# Patient Record
Sex: Female | Born: 1960 | Race: Black or African American | Hispanic: No | Marital: Married | Smoking: Former smoker
Health system: Southern US, Community
[De-identification: ages and names within clinical notes are randomized; demographics above are authoritative.]

## PROBLEM LIST (undated history)

## (undated) ENCOUNTER — Ambulatory Visit: Discharge status: Absent without leave | Payer: BC Managed Care – PPO

## (undated) DIAGNOSIS — I10 Essential (primary) hypertension: Secondary | ICD-10-CM

## (undated) DIAGNOSIS — E7800 Pure hypercholesterolemia, unspecified: Secondary | ICD-10-CM

## (undated) HISTORY — PX: OTHER SURGICAL HISTORY: SHX169

## (undated) HISTORY — PX: ABDOMINAL HYSTERECTOMY: SHX81

---

## 2001-01-27 ENCOUNTER — Emergency Department (HOSPITAL_COMMUNITY): Admission: EM | Admit: 2001-01-27 | Discharge: 2001-01-27 | Payer: Self-pay | Admitting: Emergency Medicine

## 2001-05-09 ENCOUNTER — Other Ambulatory Visit: Admission: RE | Admit: 2001-05-09 | Discharge: 2001-05-09 | Payer: Self-pay | Admitting: Obstetrics and Gynecology

## 2001-05-30 ENCOUNTER — Encounter: Payer: Self-pay | Admitting: Obstetrics and Gynecology

## 2001-05-30 ENCOUNTER — Ambulatory Visit (HOSPITAL_COMMUNITY): Admission: RE | Admit: 2001-05-30 | Discharge: 2001-05-30 | Payer: Self-pay | Admitting: Obstetrics and Gynecology

## 2001-06-25 ENCOUNTER — Ambulatory Visit (HOSPITAL_COMMUNITY): Admission: RE | Admit: 2001-06-25 | Discharge: 2001-06-25 | Payer: Self-pay | Admitting: Obstetrics and Gynecology

## 2001-06-25 ENCOUNTER — Encounter: Payer: Self-pay | Admitting: Obstetrics and Gynecology

## 2001-07-12 ENCOUNTER — Ambulatory Visit (HOSPITAL_COMMUNITY): Admission: RE | Admit: 2001-07-12 | Discharge: 2001-07-12 | Payer: Self-pay | Admitting: General Surgery

## 2003-03-09 ENCOUNTER — Emergency Department (HOSPITAL_COMMUNITY): Admission: EM | Admit: 2003-03-09 | Discharge: 2003-03-09 | Payer: Self-pay | Admitting: *Deleted

## 2003-05-08 ENCOUNTER — Emergency Department (HOSPITAL_COMMUNITY): Admission: EM | Admit: 2003-05-08 | Discharge: 2003-05-08 | Payer: Self-pay | Admitting: *Deleted

## 2003-06-12 ENCOUNTER — Ambulatory Visit (HOSPITAL_COMMUNITY): Admission: RE | Admit: 2003-06-12 | Discharge: 2003-06-12 | Payer: Self-pay | Admitting: General Surgery

## 2003-06-12 ENCOUNTER — Encounter: Payer: Self-pay | Admitting: General Surgery

## 2004-07-20 ENCOUNTER — Emergency Department (HOSPITAL_COMMUNITY): Admission: EM | Admit: 2004-07-20 | Discharge: 2004-07-20 | Payer: Self-pay | Admitting: Emergency Medicine

## 2004-11-08 ENCOUNTER — Ambulatory Visit (HOSPITAL_COMMUNITY): Admission: RE | Admit: 2004-11-08 | Discharge: 2004-11-08 | Payer: Self-pay | Admitting: General Surgery

## 2005-02-04 ENCOUNTER — Emergency Department (HOSPITAL_COMMUNITY): Admission: EM | Admit: 2005-02-04 | Discharge: 2005-02-04 | Payer: Self-pay | Admitting: *Deleted

## 2006-01-15 ENCOUNTER — Ambulatory Visit (HOSPITAL_COMMUNITY): Admission: RE | Admit: 2006-01-15 | Discharge: 2006-01-15 | Payer: Self-pay | Admitting: General Surgery

## 2006-01-15 ENCOUNTER — Emergency Department (HOSPITAL_COMMUNITY): Admission: EM | Admit: 2006-01-15 | Discharge: 2006-01-15 | Payer: Self-pay | Admitting: Emergency Medicine

## 2006-01-26 ENCOUNTER — Emergency Department (HOSPITAL_COMMUNITY): Admission: EM | Admit: 2006-01-26 | Discharge: 2006-01-26 | Payer: Self-pay | Admitting: Emergency Medicine

## 2006-02-02 ENCOUNTER — Ambulatory Visit (HOSPITAL_COMMUNITY): Admission: RE | Admit: 2006-02-02 | Discharge: 2006-02-02 | Payer: Self-pay | Admitting: General Surgery

## 2006-02-06 ENCOUNTER — Encounter (HOSPITAL_COMMUNITY): Admission: RE | Admit: 2006-02-06 | Discharge: 2006-03-08 | Payer: Self-pay | Admitting: General Surgery

## 2006-03-09 ENCOUNTER — Encounter (HOSPITAL_COMMUNITY): Admission: RE | Admit: 2006-03-09 | Discharge: 2006-04-08 | Payer: Self-pay | Admitting: General Surgery

## 2006-05-02 ENCOUNTER — Ambulatory Visit: Payer: Self-pay | Admitting: Family Medicine

## 2006-05-21 ENCOUNTER — Encounter (INDEPENDENT_AMBULATORY_CARE_PROVIDER_SITE_OTHER): Payer: Self-pay | Admitting: Family Medicine

## 2006-05-21 LAB — CONVERTED CEMR LAB
RBC count: 4.63 10*6/uL
TSH: 0.711 microintl units/mL
WBC, blood: 7.1 10*3/uL

## 2006-06-05 ENCOUNTER — Ambulatory Visit: Payer: Self-pay | Admitting: Family Medicine

## 2006-06-12 ENCOUNTER — Encounter (HOSPITAL_COMMUNITY): Admission: RE | Admit: 2006-06-12 | Discharge: 2006-07-12 | Payer: Self-pay | Admitting: Family Medicine

## 2006-07-16 ENCOUNTER — Encounter (HOSPITAL_COMMUNITY): Admission: RE | Admit: 2006-07-16 | Discharge: 2006-08-15 | Payer: Self-pay | Admitting: Family Medicine

## 2006-07-17 ENCOUNTER — Ambulatory Visit: Payer: Self-pay | Admitting: Family Medicine

## 2006-08-08 ENCOUNTER — Ambulatory Visit: Payer: Self-pay | Admitting: Family Medicine

## 2006-08-14 ENCOUNTER — Ambulatory Visit: Payer: Self-pay | Admitting: Family Medicine

## 2006-08-16 ENCOUNTER — Encounter (HOSPITAL_COMMUNITY): Admission: RE | Admit: 2006-08-16 | Discharge: 2006-09-03 | Payer: Self-pay | Admitting: Family Medicine

## 2006-08-18 ENCOUNTER — Encounter: Payer: Self-pay | Admitting: Family Medicine

## 2006-08-18 DIAGNOSIS — R7989 Other specified abnormal findings of blood chemistry: Secondary | ICD-10-CM | POA: Insufficient documentation

## 2006-08-18 DIAGNOSIS — E1169 Type 2 diabetes mellitus with other specified complication: Secondary | ICD-10-CM | POA: Insufficient documentation

## 2006-08-18 DIAGNOSIS — L259 Unspecified contact dermatitis, unspecified cause: Secondary | ICD-10-CM

## 2006-08-18 DIAGNOSIS — E785 Hyperlipidemia, unspecified: Secondary | ICD-10-CM

## 2006-08-18 DIAGNOSIS — E669 Obesity, unspecified: Secondary | ICD-10-CM

## 2006-08-18 DIAGNOSIS — I10 Essential (primary) hypertension: Secondary | ICD-10-CM | POA: Insufficient documentation

## 2006-08-18 DIAGNOSIS — M1990 Unspecified osteoarthritis, unspecified site: Secondary | ICD-10-CM | POA: Insufficient documentation

## 2006-09-25 ENCOUNTER — Ambulatory Visit: Payer: Self-pay | Admitting: Family Medicine

## 2006-09-25 ENCOUNTER — Ambulatory Visit (HOSPITAL_COMMUNITY): Admission: RE | Admit: 2006-09-25 | Discharge: 2006-09-25 | Payer: Self-pay | Admitting: Family Medicine

## 2006-09-26 ENCOUNTER — Encounter (INDEPENDENT_AMBULATORY_CARE_PROVIDER_SITE_OTHER): Payer: Self-pay | Admitting: Family Medicine

## 2006-09-26 LAB — CONVERTED CEMR LAB: Uric Acid, Serum: 5.7 mg/dL (ref 2.4–7.0)

## 2006-10-23 ENCOUNTER — Ambulatory Visit: Payer: Self-pay | Admitting: Family Medicine

## 2006-10-23 LAB — CONVERTED CEMR LAB
Cholesterol, target level: 200 mg/dL
LDL Goal: 160 mg/dL

## 2006-11-06 ENCOUNTER — Encounter (INDEPENDENT_AMBULATORY_CARE_PROVIDER_SITE_OTHER): Payer: Self-pay | Admitting: Family Medicine

## 2006-12-04 ENCOUNTER — Encounter (INDEPENDENT_AMBULATORY_CARE_PROVIDER_SITE_OTHER): Payer: Self-pay | Admitting: Family Medicine

## 2007-01-14 ENCOUNTER — Encounter (INDEPENDENT_AMBULATORY_CARE_PROVIDER_SITE_OTHER): Payer: Self-pay | Admitting: Family Medicine

## 2007-03-29 IMAGING — CR DG ANKLE COMPLETE 3+V*R*
3 series · 3 of 3 positions shown · non-contrast
Comparison: None.

CLINICAL DATA: Pain.
 RIGHT ANKLE ? 3 VIEWS:

[view not recorded (1 of 3)]
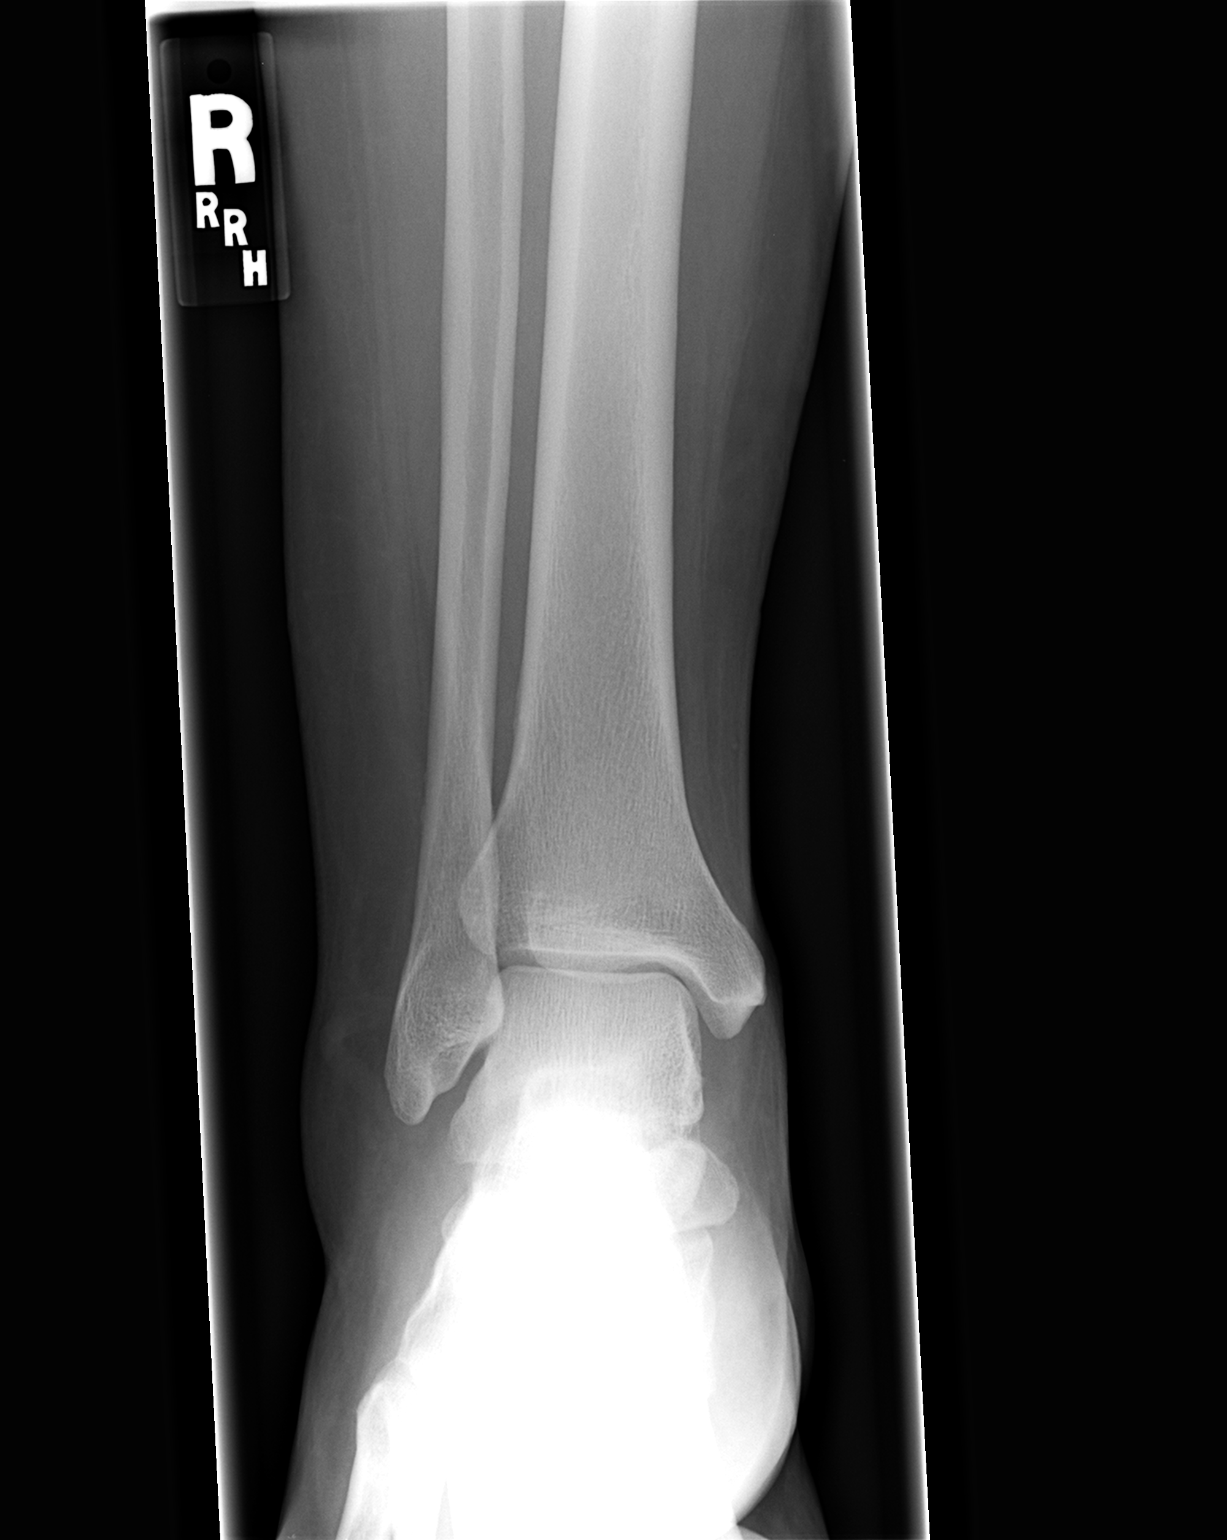

[view not recorded (2 of 3)]
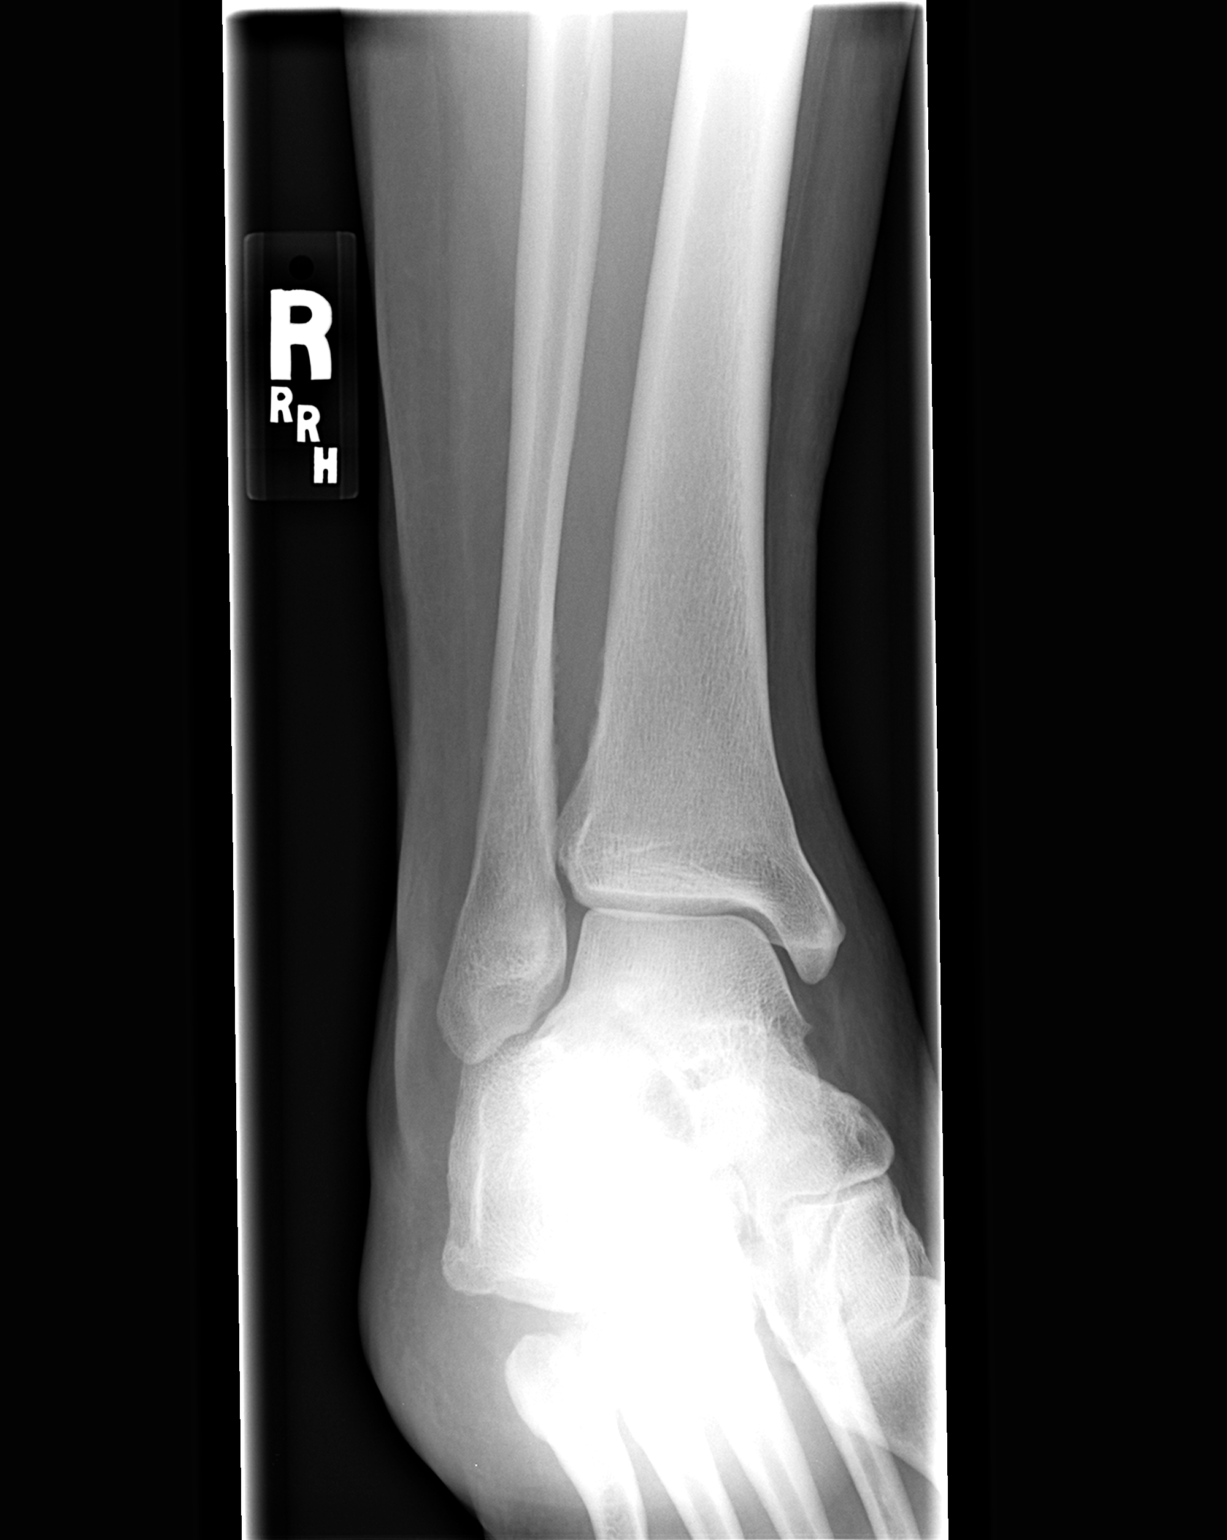

[view not recorded (3 of 3)]
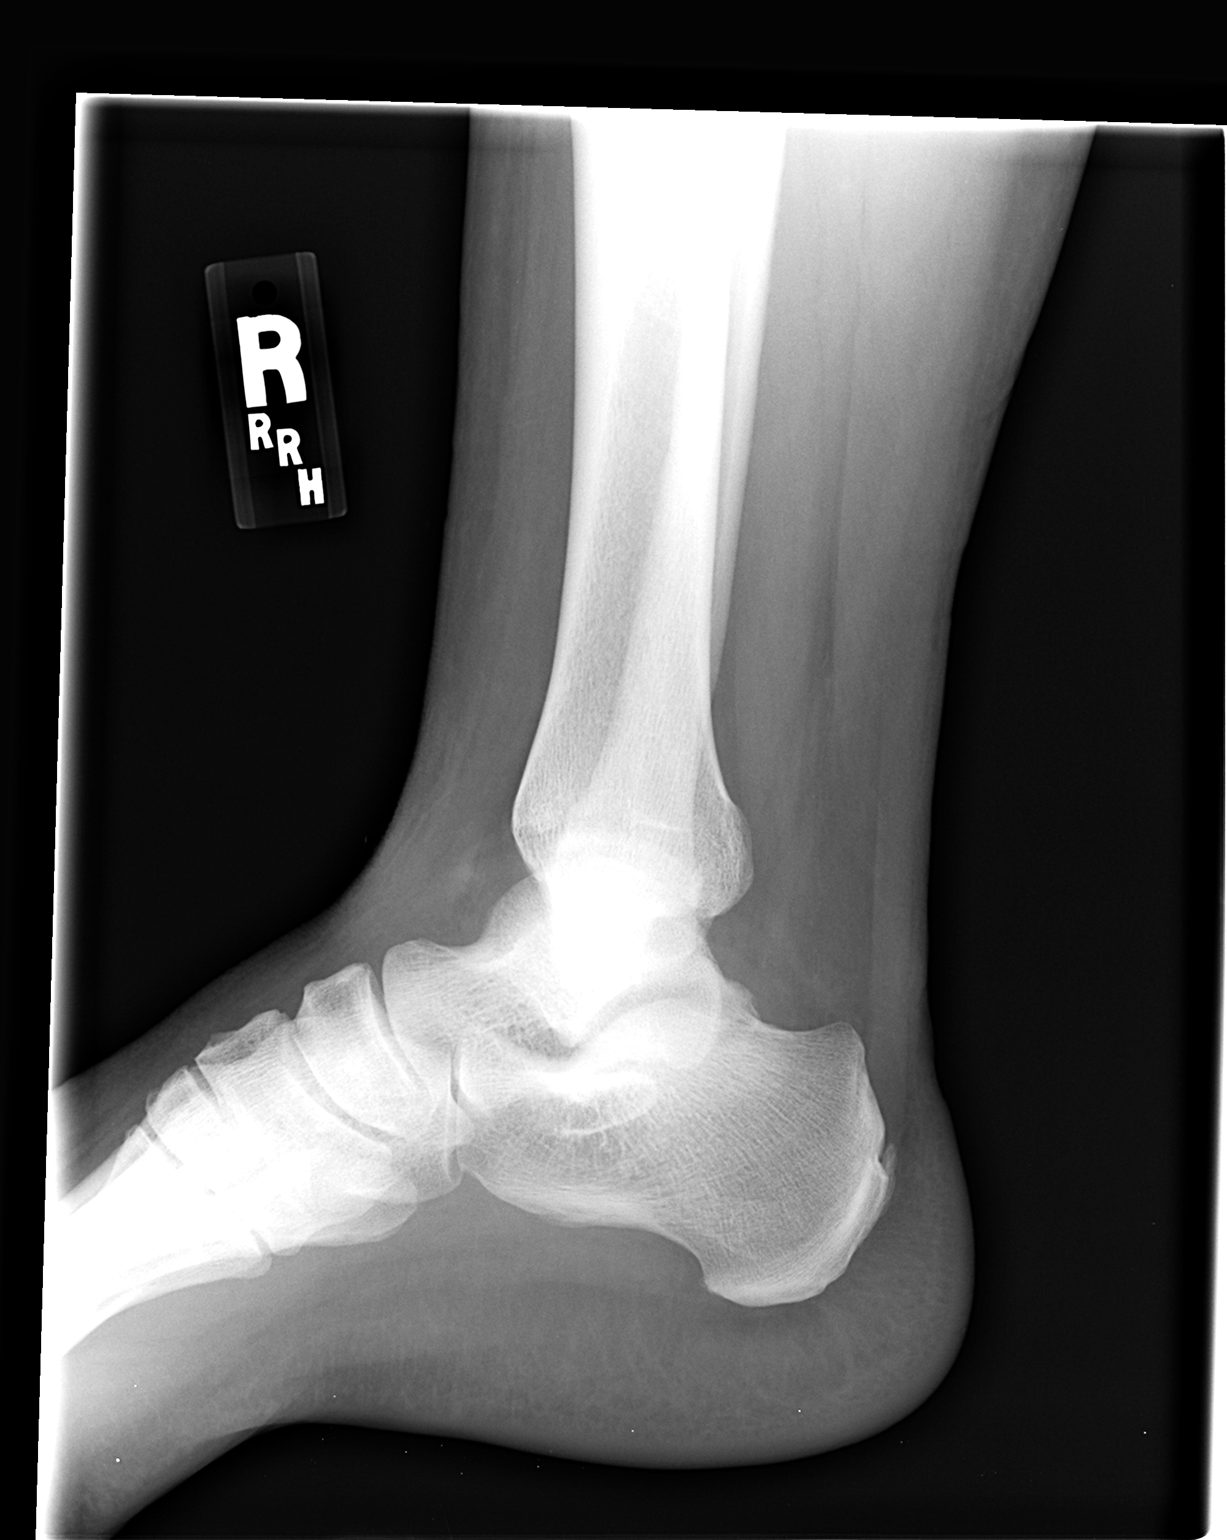

[3 of 3 positions shown; findings below may reference images not displayed]

FINDINGS: Imaged bones, joints, and soft tissues appear normal.
IMPRESSION: No acute finding.

## 2007-05-31 ENCOUNTER — Ambulatory Visit: Payer: Self-pay | Admitting: Family Medicine

## 2007-07-12 ENCOUNTER — Ambulatory Visit: Payer: Self-pay | Admitting: Family Medicine

## 2007-07-12 ENCOUNTER — Telehealth (INDEPENDENT_AMBULATORY_CARE_PROVIDER_SITE_OTHER): Payer: Self-pay | Admitting: *Deleted

## 2007-09-27 ENCOUNTER — Emergency Department (HOSPITAL_COMMUNITY): Admission: EM | Admit: 2007-09-27 | Discharge: 2007-09-27 | Payer: Self-pay | Admitting: Emergency Medicine

## 2007-10-02 ENCOUNTER — Encounter (INDEPENDENT_AMBULATORY_CARE_PROVIDER_SITE_OTHER): Payer: Self-pay | Admitting: Family Medicine

## 2007-10-03 ENCOUNTER — Encounter (INDEPENDENT_AMBULATORY_CARE_PROVIDER_SITE_OTHER): Payer: Self-pay | Admitting: Family Medicine

## 2007-10-18 ENCOUNTER — Ambulatory Visit (HOSPITAL_COMMUNITY): Admission: RE | Admit: 2007-10-18 | Discharge: 2007-10-18 | Payer: Self-pay | Admitting: Family Medicine

## 2007-10-18 ENCOUNTER — Encounter (INDEPENDENT_AMBULATORY_CARE_PROVIDER_SITE_OTHER): Payer: Self-pay | Admitting: Family Medicine

## 2007-10-19 LAB — CONVERTED CEMR LAB
BUN: 9 mg/dL (ref 6–23)
Basophils Absolute: 0 10*3/uL (ref 0.0–0.1)
CO2: 20 meq/L (ref 19–32)
Calcium: 9.2 mg/dL (ref 8.4–10.5)
Eosinophils Relative: 3 % (ref 0–5)
Glucose, Bld: 93 mg/dL (ref 70–99)
HDL: 79 mg/dL (ref >39)
LDL Cholesterol: 107 mg/dL — ABNORMAL HIGH (ref 0–99)
Lymphocytes Relative: 59 % — ABNORMAL HIGH (ref 12–46)
Lymphs Abs: 3.5 10*3/uL (ref 0.7–4.0)
MCHC: 32.5 g/dL (ref 30.0–36.0)
MCV: 82.5 fL (ref 78.0–100.0)
Monocytes Absolute: 0.4 10*3/uL (ref 0.1–1.0)
Monocytes Relative: 6 % (ref 3–12)
Neutro Abs: 1.9 10*3/uL (ref 1.7–7.7)
Potassium: 3.8 meq/L (ref 3.5–5.3)
RBC: 4.29 M/uL (ref 3.87–5.11)
Sodium: 142 meq/L (ref 135–145)
Total Bilirubin: 0.4 mg/dL (ref 0.3–1.2)
WBC: 5.9 10*3/uL (ref 4.0–10.5)

## 2007-10-21 ENCOUNTER — Telehealth (INDEPENDENT_AMBULATORY_CARE_PROVIDER_SITE_OTHER): Payer: Self-pay | Admitting: *Deleted

## 2007-10-21 ENCOUNTER — Encounter (INDEPENDENT_AMBULATORY_CARE_PROVIDER_SITE_OTHER): Payer: Self-pay | Admitting: Family Medicine

## 2007-10-25 ENCOUNTER — Ambulatory Visit: Payer: Self-pay | Admitting: Family Medicine

## 2007-10-25 DIAGNOSIS — D649 Anemia, unspecified: Secondary | ICD-10-CM

## 2007-10-31 LAB — CONVERTED CEMR LAB: Retic Ct Pct: 1.1 % (ref 0.4–3.1)

## 2007-11-29 ENCOUNTER — Encounter (INDEPENDENT_AMBULATORY_CARE_PROVIDER_SITE_OTHER): Payer: Self-pay | Admitting: Family Medicine

## 2008-02-07 ENCOUNTER — Ambulatory Visit: Payer: Self-pay | Admitting: Family Medicine

## 2008-02-07 LAB — CONVERTED CEMR LAB
Glucose, Urine, Semiquant: NEGATIVE
Protein, U semiquant: NEGATIVE
Urobilinogen, UA: 1
pH: 5.5

## 2008-02-08 ENCOUNTER — Encounter (INDEPENDENT_AMBULATORY_CARE_PROVIDER_SITE_OTHER): Payer: Self-pay | Admitting: Family Medicine

## 2008-02-19 ENCOUNTER — Encounter (INDEPENDENT_AMBULATORY_CARE_PROVIDER_SITE_OTHER): Payer: Self-pay | Admitting: *Deleted

## 2008-02-19 LAB — CONVERTED CEMR LAB
BUN: 17 mg/dL (ref 6–23)
CO2: 22 meq/L (ref 19–32)
Calcium: 9.9 mg/dL (ref 8.4–10.5)
Creatinine, Ser: 0.77 mg/dL (ref 0.40–1.20)
Eosinophils Absolute: 0.3 10*3/uL (ref 0.0–0.7)
HCT: 39 % (ref 36.0–46.0)
Hemoglobin: 12.3 g/dL (ref 12.0–15.0)
Lymphs Abs: 3.4 10*3/uL (ref 0.7–4.0)
Neutro Abs: 3.3 10*3/uL (ref 1.7–7.7)
Platelets: 285 10*3/uL (ref 150–400)
Potassium: 3.7 meq/L (ref 3.5–5.3)
RBC / HPF: NONE SEEN (ref <3)
RBC: 4.73 M/uL (ref 3.87–5.11)
RDW: 14.6 % (ref 11.5–15.5)
Sodium: 143 meq/L (ref 135–145)
WBC: 7.4 10*3/uL (ref 4.0–10.5)

## 2008-03-20 ENCOUNTER — Ambulatory Visit: Payer: Self-pay | Admitting: Family Medicine

## 2008-06-19 ENCOUNTER — Encounter (INDEPENDENT_AMBULATORY_CARE_PROVIDER_SITE_OTHER): Payer: Self-pay | Admitting: Family Medicine

## 2008-09-24 ENCOUNTER — Ambulatory Visit: Payer: Self-pay | Admitting: Family Medicine

## 2008-10-16 ENCOUNTER — Encounter (INDEPENDENT_AMBULATORY_CARE_PROVIDER_SITE_OTHER): Payer: Self-pay | Admitting: Family Medicine

## 2008-10-22 LAB — CONVERTED CEMR LAB
ALT: 16 units/L (ref 0–35)
Albumin: 4.4 g/dL (ref 3.5–5.2)
Basophils Relative: 0 % (ref 0–1)
Cholesterol: 226 mg/dL — ABNORMAL HIGH (ref 0–200)
Creatinine, Ser: 0.71 mg/dL (ref 0.40–1.20)
Eosinophils Relative: 3 % (ref 0–5)
HDL: 84 mg/dL (ref >39)
Hemoglobin: 11.9 g/dL — ABNORMAL LOW (ref 12.0–15.0)
LDL Cholesterol: 126 mg/dL — ABNORMAL HIGH (ref 0–99)
MCHC: 31.6 g/dL (ref 30.0–36.0)
Monocytes Absolute: 0.6 10*3/uL (ref 0.1–1.0)
RBC: 4.67 M/uL (ref 3.87–5.11)
Sodium: 140 meq/L (ref 135–145)
TSH: 0.834 microintl units/mL (ref 0.350–4.50)
Total Bilirubin: 0.4 mg/dL (ref 0.3–1.2)
Total CHOL/HDL Ratio: 2.7
VLDL: 16 mg/dL (ref 0–40)
WBC: 9.4 10*3/uL (ref 4.0–10.5)

## 2008-10-26 ENCOUNTER — Ambulatory Visit: Payer: Self-pay | Admitting: Family Medicine

## 2008-10-26 DIAGNOSIS — J209 Acute bronchitis, unspecified: Secondary | ICD-10-CM

## 2008-10-26 DIAGNOSIS — IMO0001 Reserved for inherently not codable concepts without codable children: Secondary | ICD-10-CM

## 2008-10-27 ENCOUNTER — Telehealth (INDEPENDENT_AMBULATORY_CARE_PROVIDER_SITE_OTHER): Payer: Self-pay | Admitting: Family Medicine

## 2008-10-27 ENCOUNTER — Encounter (INDEPENDENT_AMBULATORY_CARE_PROVIDER_SITE_OTHER): Payer: Self-pay | Admitting: Family Medicine

## 2008-11-05 ENCOUNTER — Telehealth (INDEPENDENT_AMBULATORY_CARE_PROVIDER_SITE_OTHER): Payer: Self-pay | Admitting: Family Medicine

## 2008-11-24 ENCOUNTER — Encounter (INDEPENDENT_AMBULATORY_CARE_PROVIDER_SITE_OTHER): Payer: Self-pay | Admitting: Family Medicine

## 2009-02-02 ENCOUNTER — Emergency Department (HOSPITAL_COMMUNITY): Admission: EM | Admit: 2009-02-02 | Discharge: 2009-02-02 | Payer: Self-pay | Admitting: Emergency Medicine

## 2009-02-15 ENCOUNTER — Ambulatory Visit (HOSPITAL_COMMUNITY): Admission: RE | Admit: 2009-02-15 | Discharge: 2009-02-15 | Payer: Self-pay | Admitting: Family Medicine

## 2010-09-24 ENCOUNTER — Encounter: Payer: Self-pay | Admitting: General Surgery

## 2010-09-25 ENCOUNTER — Encounter: Payer: Self-pay | Admitting: General Surgery

## 2010-09-25 ENCOUNTER — Encounter: Payer: Self-pay | Admitting: Family Medicine

## 2010-12-12 LAB — CBC
MCV: 81.2 fL (ref 78.0–100.0)
Platelets: 229 10*3/uL (ref 150–400)
RDW: 15.1 % (ref 11.5–15.5)

## 2010-12-12 LAB — DIFFERENTIAL
Basophils Relative: 0 % (ref 0–1)
Eosinophils Absolute: 0.3 10*3/uL (ref 0.0–0.7)
Monocytes Relative: 8 % (ref 3–12)
Neutro Abs: 2.8 10*3/uL (ref 1.7–7.7)

## 2010-12-12 LAB — BASIC METABOLIC PANEL
BUN: 13 mg/dL (ref 6–23)
CO2: 29 mEq/L (ref 19–32)
Calcium: 9.3 mg/dL (ref 8.4–10.5)
GFR calc Af Amer: >60 mL/min (ref >60)
Glucose, Bld: 121 mg/dL — ABNORMAL HIGH (ref 70–99)
Sodium: 141 mEq/L (ref 135–145)

## 2011-01-20 NOTE — H&P (Signed)
[facility]  Patient:    Beverly Mccann Visit Number: [id number] MRN: [id number]          Service Type: OUT Location: RAD Attending Physician:  Tilda Burrow Dictated by:   Elpidio Anis, M.D. Admit Date:  06/25/2001 Discharge Date: 06/25/2001                           History and Physical  CHIEF COMPLAINT: This is a 50 year old female, with a history of a new nodule in her left breast on routine examination.  HISTORY OF PRESENT ILLNESS: Mammography showed a density in the left upper outer quadrant of the breast and ultrasound showed an isoechoic mass of the breast between 12:30 and 1 oclock which measured 1.7 x 1.4 x 0.8 cm.  The mass was felt to be suggestive of either fibroadenoma or solid neoplasm.  She is scheduled for partial mastectomy.  PAST MEDICAL HISTORY: She has hypertension.  MEDICATIONS:  1. Cardura 4 mg q.d.  2. Lotrel 5/10 mg q.d.  PAST SURGICAL HISTORY: Total abdominal hysterectomy.  SOCIAL HISTORY: She smokes one pack a day.  She drinks an occasional beer. She does not use drugs.  ALLERGIES: No known drug allergies.  PHYSICAL EXAMINATION:  VITAL SIGNS: Blood pressure 150/100, pulse 84, respirations 20.  Weight 192 pounds.  HEENT: Unremarkable.  NECK: Supple without JVD or bruits.  CHEST: Clear to auscultation.  HEART: Regular rate and rhythm without murmurs, rubs, or gallops.  BREAST: Mass in upper outer quadrant of breast near the tail of breast which is soft.  There is a nontender node of the lower axilla.  ABDOMEN: Soft, nontender, no masses, normoactive bowel sounds.  EXTREMITIES: No clubbing, cyanosis, or edema.  No joint deformity.  NEUROLOGIC: Nonfocal.  No cerebellar deficit.  IMPRESSION:  1. Left breast mass, upper outer quadrant.  2. Hypertension.  PLAN: Left partial mastectomy.Dictated by:   Elpidio Anis, M.D.  Attending Physician:  Tilda Burrow DD:  07/11/01 TD:  07/12/01 Job:  [id number]

## 2011-01-20 NOTE — Op Note (Signed)
[facility]  Patient:    Beverly Mccann, Beverly Mccann Visit Number: [id number] MRN: [id number]          Service Type: DSU Location: DAY Attending Physician:  Dessa Phi Dictated by:   Elpidio Anis, M.D. Admit Date:  07/12/2001                             Operative Report  PREOPERATIVE DIAGNOSIS:  Left breast mass.  POSTOPERATIVE DIAGNOSIS:  Left breast mass.  OPERATION:  Left partial mastectomy.  SURGEON:  Elpidio Anis, M.D.  DESCRIPTION OF PROCEDURE:   Under general endotracheal anesthesia, the patients breast was prepped and draped in sterile field.  Curvilinear incision was made in the upper outer quadrant near the base of the axilla. Incision extended through subcutaneous tissue.  The mass was palpated and grasped with an Allis forceps.  Wide margin of tissue was taken around the mass the ensure complete removal.  The dissection extended out to the chest wall.  The mass was sent for pathologic evaluation.  Frozen section diagnosis returned as probable benign.  Permanent section is pending.  The deep tissues were closed with interrupted 2-0 Biosyn.  Subcutaneous tissues were closed with interrupted 2-0 Biosyn.  Skin was closed with subcuticular 3-0 Dexon. Dressing was placed.  The patient tolerated the procedure well.  She was awakened from anesthesia uneventfully, transferred to a bed, and taken to the postanesthetic care unit. Dictated by:   Elpidio Anis, M.D. Attending Physician:  Dessa Phi DD:  07/12/01 TD:  07/13/01 Job: [id number]

## 2011-05-25 LAB — URINALYSIS, ROUTINE W REFLEX MICROSCOPIC
Bilirubin Urine: NEGATIVE
Glucose, UA: NEGATIVE
Ketones, ur: NEGATIVE
Leukocytes, UA: NEGATIVE
Nitrite: NEGATIVE
Protein, ur: 30 — AB
Specific Gravity, Urine: 1.01
Urobilinogen, UA: 1

## 2011-08-06 ENCOUNTER — Emergency Department (HOSPITAL_COMMUNITY)
Admission: EM | Admit: 2011-08-06 | Discharge: 2011-08-06 | Disposition: A | Discharge status: Home / Self Care | Payer: BC Managed Care – PPO | Attending: Emergency Medicine | Admitting: Emergency Medicine

## 2011-08-06 ENCOUNTER — Encounter: Payer: Self-pay | Admitting: *Deleted

## 2011-08-06 DIAGNOSIS — R51 Headache: Secondary | ICD-10-CM | POA: Insufficient documentation

## 2011-08-06 DIAGNOSIS — I10 Essential (primary) hypertension: Secondary | ICD-10-CM | POA: Insufficient documentation

## 2011-08-06 DIAGNOSIS — Z9079 Acquired absence of other genital organ(s): Secondary | ICD-10-CM | POA: Insufficient documentation

## 2011-08-06 DIAGNOSIS — Z7982 Long term (current) use of aspirin: Secondary | ICD-10-CM | POA: Insufficient documentation

## 2011-08-06 DIAGNOSIS — F17200 Nicotine dependence, unspecified, uncomplicated: Secondary | ICD-10-CM | POA: Insufficient documentation

## 2011-08-06 HISTORY — DX: Essential (primary) hypertension: I10

## 2011-08-06 MED ORDER — BENAZEPRIL HCL 10 MG PO TABS: 5.0000 mg | ORAL_TABLET | Freq: Every day | ORAL | Status: DC

## 2011-08-06 MED ORDER — AMLODIPINE BESYLATE 10 MG PO TABS: 5.0000 mg | ORAL_TABLET | Freq: Every day | ORAL | Status: DC

## 2011-08-06 MED ORDER — OXYCODONE-ACETAMINOPHEN 5-325 MG PO TABS
1.0000 | ORAL_TABLET | Freq: Once | ORAL | Status: AC
  Administered 2011-08-06: 1 via ORAL
  Filled 2011-08-06: qty 1

## 2011-08-06 NOTE — ED Notes (Signed)
Pt a/ox4. resp even and unlabored. NAD at this time. D/C instructions reviewed with  Pt along with Rx x2. Pt verbalized understanding. Pt ambulated to lobby with steady gate.

## 2011-08-06 NOTE — ED Notes (Signed)
Pt c/o headache since 8am today; pt states she lost her job and has been out of her BP meds

## 2011-08-06 NOTE — ED Provider Notes (Signed)
History  Scribed for Joya Gaskins, MD, the patient was seen in APFT24/APFT24. The chart was scribed by Gilman Schmidt. The patients care was started at 4:07 PM.   CSN: [id number] Arrival date & time: 08/06/2011  3:22 PM   First MD Initiated Contact with Patient 08/06/11 1547      Chief Complaint  Patient presents with  . Headache     HPI Beverly Mccann is a 50 y.o. female with a history of Hypertension who presents to the Emergency Department complaining of right and left sided headache onset ~8 hours ago. Pt states that pain is 9/10. Denies any numbness, LOC, fever, weakness, SOB, chest pain, vomiting or visual changes. Patient additionally notes recent loss of job and being unable to acquire BP meds. Pt does have appointment scheduled for end of January with PCP. There are no other associated symptoms and no other alleviating or aggravating factors.  No head trauma HA onset gradual No sick contacts at home    Past Medical History  Diagnosis Date  . Hypertension     Past Surgical History  Procedure Date  . Abdominal hysterectomy     History reviewed. No pertinent family history.  History  Substance Use Topics  . Smoking status: Current Everyday Smoker  . Smokeless tobacco: Not on file  . Alcohol Use: Yes     occasionally    OB History    Grav Para Term Preterm Abortions TAB SAB Ect Mult Living                  Review of Systems  Constitutional: Negative for fever.  Eyes: Negative for photophobia, pain, redness, itching and visual disturbance.  Respiratory: Negative for shortness of breath.   Cardiovascular: Negative for chest pain.  Gastrointestinal: Negative for vomiting and diarrhea.  Neurological: Positive for headaches. Negative for syncope and weakness.  All other systems reviewed and are negative.    Allergies  Review of patient's allergies indicates no known allergies.  Home Medications   Current Outpatient Rx  Name Route Sig Dispense Refill  .  ASPIRIN 325 MG PO TABS Oral Take 325 mg by mouth daily as needed. For pain     . AMLODIPINE BESYLATE 10 MG PO TABS Oral Take 0.5 tablets (5 mg total) by mouth daily. 30 tablet 0  . BENAZEPRIL HCL 10 MG PO TABS Oral Take 0.5 tablets (5 mg total) by mouth daily. 30 tablet 0    BP 187/89  Pulse 68  Temp(Src) 98.2 F (36.8 C) (Oral)  Resp 18  Ht 5\' 3"  (1.6 m)  Wt 239 lb (108.41 kg)  BMI 42.34 kg/m2  SpO2 100%  Physical Exam CONSTITUTIONAL: Well developed/well nourished HEAD AND FACE: Normocephalic/atraumatic EYES: EOMI/PERRL ENMT: Mucous membranes moist NECK: supple no meningeal signs SPINE:entire spine nontender CV: S1/S2 noted, no murmurs/rubs/gallops noted LUNGS: Lungs are clear to auscultation bilaterally, no apparent distress ABDOMEN: soft, nontender, no rebound or guarding GU:no cva tenderness NEURO: Pt is awake/alert, moves all extremitiesx4 Awake/alert, facies symmetric, no arm or leg drift is noted Cranial nerves 3/4/5/6/03/12/09/11/12 tested and intact Gait normal No pastpointing EXTREMITIES: pulses normal, full ROM SKIN: warm, color normal PSYCH: no abnormalities of mood noted    ED Course  Procedures No results found.   1. Headache   2. Hypertension    DIAGNOSTIC STUDIES: Oxygen Saturation is 100% on room air, normal by my interpretation.    COORDINATION OF CARE: 4:07pm:  - Patient evaluated by ED physician, Percocet ordered  MDM  Nursing notes reviewed and considered in documentation rx given for the month advised PCP followup   I personally performed the services described in this documentation, which was scribed in my presence. The recorded information has been reviewed and considered.          Joya Gaskins, MD 08/07/11 [phone]

## 2011-10-24 ENCOUNTER — Telehealth: Payer: Self-pay

## 2011-10-24 NOTE — Telephone Encounter (Signed)
Called, left message for pt to call

## 2011-11-02 NOTE — Telephone Encounter (Signed)
Letter mailed for pt to call to schedule 

## 2011-12-19 ENCOUNTER — Telehealth: Payer: Self-pay

## 2011-12-19 NOTE — Telephone Encounter (Signed)
Called. Line was busy.  

## 2012-01-01 NOTE — Telephone Encounter (Signed)
Called [phone]. Number has been disconnected. Called the mobile number listed and sent message.

## 2012-01-09 NOTE — Telephone Encounter (Signed)
Letters sent to pt and PCP.

## 2012-01-17 ENCOUNTER — Telehealth: Payer: Self-pay

## 2012-01-17 ENCOUNTER — Other Ambulatory Visit: Payer: Self-pay

## 2012-01-17 DIAGNOSIS — Z139 Encounter for screening, unspecified: Secondary | ICD-10-CM

## 2012-01-17 NOTE — Telephone Encounter (Signed)
Gastroenterology Pre-Procedure Form    Request Date: 01/16/2012    Requesting Physician: Dr. Sherwood Gambler     PATIENT INFORMATION:  Beverly Mccann is a 51 y.o., female (DOB=Jun 19, 1961).  PROCEDURE: Procedure(s) requested: colonoscopy Procedure Reason: screening for colon cancer  PATIENT REVIEW QUESTIONS: The patient reports the following:   1. Diabetes Melitis: no 2. Joint replacements in the past 12 months: no 3. Major health problems in the past 3 months: no 4. Has an artificial valve or MVP:no 5. Has been advised in past to take antibiotics in advance of a procedure like teeth cleaning: no}    MEDICATIONS & ALLERGIES:    Patient reports the following regarding taking any blood thinners:   Plavix? no Aspirin?yes  Coumadin?  no  Patient confirms/reports the following medications:  Current Outpatient Prescriptions  Medication Sig Dispense Refill  . amLODipine (NORVASC) 10 MG tablet Take 0.5 tablets (5 mg total) by mouth daily.  30 tablet  0  . aspirin 81 MG tablet Take 81 mg by mouth daily.      . benazepril (LOTENSIN) 10 MG tablet Take 40 mg by mouth daily.      . hydrochlorothiazide (HYDRODIURIL) 25 MG tablet Take 25 mg by mouth daily.      . pravastatin (PRAVACHOL) 20 MG tablet Take 20 mg by mouth at bedtime.      Marland Kitchen DISCONTD: benazepril (LOTENSIN) 10 MG tablet Take 0.5 tablets (5 mg total) by mouth daily.  30 tablet  0    Patient confirms/reports the following allergies:  No Known Allergies  Patient is appropriate to schedule for requested procedure(s): yes  AUTHORIZATION INFORMATION Primary Insurance:  ID #:   Group #:  Pre-Cert / Auth required:  Pre-Cert / Auth #:   Secondary Insurance:   ID #:   Group #:  Pre-Cert / Auth required: Pre-Cert / Auth #:   No orders of the defined types were placed in this encounter.    SCHEDULE INFORMATION: Procedure has been scheduled as follows:  Date: 02/02/2012         Time: 10:30 AM  Location: [facility] Short  Stay  This Gastroenterology Pre-Precedure Form is being routed to the following provider(s) for review: Jonette Eva, MD

## 2012-01-17 NOTE — Telephone Encounter (Signed)
HOLD HCTZ THE DAY PRIOR TO AND DAY OF TCS. DRINK FLUIDS TO KEEP URINE LIGHT YELLOW.

## 2012-01-17 NOTE — Telephone Encounter (Signed)
PREPOPIK-REGULAR BREAKFAST. CLEAR LIQUIDS AFTER 9 AM.

## 2012-01-25 ENCOUNTER — Other Ambulatory Visit: Payer: Self-pay | Admitting: Gastroenterology

## 2012-01-25 MED ORDER — SOD PICOSULFATE-MAG OX-CIT ACD 10-3.5-12 MG-GM-GM PO PACK: 1.0000 | PACK | Freq: Once | ORAL | Status: DC

## 2012-01-25 NOTE — Telephone Encounter (Signed)
Prescription was sent to pharmacy [facility] in [place]) . Sent a note of that along with instructions and coupon for savings. Mailing today.

## 2012-01-30 ENCOUNTER — Telehealth: Payer: Self-pay | Admitting: Gastroenterology

## 2012-01-30 NOTE — Telephone Encounter (Signed)
Pt called to speak with [facility] nurse regarding medicine. Please call her at [phone]

## 2012-01-31 NOTE — Telephone Encounter (Signed)
Called. VM not set up. Faxed new prescription and instructions to [facility] in [place] per her request yesterday afternoon. Hope Pigeon)

## 2012-01-31 NOTE — Telephone Encounter (Signed)
Pt is aware and pharmacist, Victorino Dike confirmed it was received and that pt is to get the instructions also.

## 2012-02-01 ENCOUNTER — Telehealth: Payer: Self-pay

## 2012-02-01 MED ORDER — SODIUM CHLORIDE 0.45 % IV SOLN
Freq: Once | INTRAVENOUS | Status: AC
  Administered 2012-02-12: 08:00:00 via INTRAVENOUS

## 2012-02-01 NOTE — Telephone Encounter (Signed)
Pt called and rescheduled her procedure from 02/02/2012 to 02/12/2012 at 9:15 AM. I left message on VM for Kim. I mailed new instructions for the pt also. She had to get a different prep because of the expense and it was called in yesterday and she was informed, but she did not pick up until today for some reason and had not even started the prep at about 3:45. She then requested a Mon and was rescheduled.

## 2012-02-02 NOTE — Telephone Encounter (Signed)
REVIEWED.  

## 2012-02-12 ENCOUNTER — Encounter (HOSPITAL_COMMUNITY)
Admission: RE | Disposition: A | Discharge status: Home / Self Care | Payer: Self-pay | Source: Ambulatory Visit | Attending: Gastroenterology

## 2012-02-12 ENCOUNTER — Ambulatory Visit (HOSPITAL_COMMUNITY)
Admission: RE | Admit: 2012-02-12 | Discharge: 2012-02-12 | Disposition: A | Discharge status: Home / Self Care | Payer: BC Managed Care – PPO | Source: Ambulatory Visit | Attending: Gastroenterology | Admitting: Gastroenterology

## 2012-02-12 ENCOUNTER — Encounter (HOSPITAL_COMMUNITY): Payer: Self-pay | Admitting: *Deleted

## 2012-02-12 DIAGNOSIS — K648 Other hemorrhoids: Secondary | ICD-10-CM

## 2012-02-12 DIAGNOSIS — D128 Benign neoplasm of rectum: Secondary | ICD-10-CM | POA: Insufficient documentation

## 2012-02-12 DIAGNOSIS — K621 Rectal polyp: Secondary | ICD-10-CM

## 2012-02-12 DIAGNOSIS — I10 Essential (primary) hypertension: Secondary | ICD-10-CM | POA: Insufficient documentation

## 2012-02-12 DIAGNOSIS — E7800 Pure hypercholesterolemia, unspecified: Secondary | ICD-10-CM | POA: Insufficient documentation

## 2012-02-12 DIAGNOSIS — Z1211 Encounter for screening for malignant neoplasm of colon: Secondary | ICD-10-CM

## 2012-02-12 DIAGNOSIS — Z139 Encounter for screening, unspecified: Secondary | ICD-10-CM

## 2012-02-12 DIAGNOSIS — Z79899 Other long term (current) drug therapy: Secondary | ICD-10-CM | POA: Insufficient documentation

## 2012-02-12 DIAGNOSIS — K620 Anal polyp: Secondary | ICD-10-CM

## 2012-02-12 HISTORY — DX: Pure hypercholesterolemia, unspecified: E78.00

## 2012-02-12 HISTORY — PX: COLONOSCOPY: SHX5424

## 2012-02-12 SURGERY — COLONOSCOPY
Anesthesia: Moderate Sedation

## 2012-02-12 MED ORDER — MIDAZOLAM HCL 5 MG/5ML IJ SOLN
INTRAMUSCULAR | Status: DC | PRN
  Administered 2012-02-12: 2 mg via INTRAVENOUS
  Administered 2012-02-12: 1 mg via INTRAVENOUS
  Administered 2012-02-12: 2 mg via INTRAVENOUS
  Administered 2012-02-12: 1 mg via INTRAVENOUS

## 2012-02-12 MED ORDER — STERILE WATER FOR IRRIGATION IR SOLN
Status: DC | PRN
  Administered 2012-02-12: 10:00:00

## 2012-02-12 MED ORDER — MEPERIDINE HCL 100 MG/ML IJ SOLN
INTRAMUSCULAR | Status: AC
  Filled 2012-02-12: qty 1

## 2012-02-12 MED ORDER — MEPERIDINE HCL 100 MG/ML IJ SOLN
INTRAMUSCULAR | Status: DC | PRN
  Administered 2012-02-12: 50 mg via INTRAVENOUS
  Administered 2012-02-12 (×2): 25 mg via INTRAVENOUS

## 2012-02-12 MED ORDER — MIDAZOLAM HCL 5 MG/5ML IJ SOLN
INTRAMUSCULAR | Status: AC
  Filled 2012-02-12: qty 10

## 2012-02-12 NOTE — Op Note (Signed)
[facility] [address] [place], [place]  [address]  COLONOSCOPY PROCEDURE REPORT  PATIENT:  Beverly Mccann, Beverly Mccann  MR#:  [id number] BIRTHDATE:  1961/02/02, 50 yrs. old  GENDER:  female  ENDOSCOPIST:  Jonette Eva, MD REF. BY:  Karleen Hampshire, M.D. ASSISTANT:  PROCEDURE DATE:  02/12/2012 PROCEDURE:  Colonoscopy with biopsy  INDICATIONS:  SCREENING  MEDICATIONS:   Demerol 100 mg IV, Versed 6 mg IV  DESCRIPTION OF PROCEDURE:    Physical exam was performed. Informed consent was obtained from the patient after explaining the benefits, risks, and alternatives to procedure.  The patient was connected to monitor and placed in left lateral position. Continuous oxygen was provided by nasal cannula and IV medicine administered through an indwelling cannula.  After administration of sedation and rectal exam, the patient's rectum was intubated and the EC-[id number] ([id number]) colonoscope was advanced under direct visualization to the cecum.  The scope was removed slowly by carefully examining the color, texture, anatomy, and integrity mucosa on the way out.  The patient was recovered in endoscopy and discharged home in satisfactory condition. <<PROCEDUREIMAGES>>  FINDINGS:  There were 8 SESSILE HYPERPLASTIC APPEARING polyps identified and removed. in the rectum VIA COLD FORCEPS.  SMALL Internal Hemorrhoids were found.  PREP QUALITY: EXCELLENT CECAL W/D TIME:    12.5 minutes  COMPLICATIONS:    None  ENDOSCOPIC IMPRESSION: 1) Polyps, multiple in the rectum 2) Internal hemorrhoids  RECOMMENDATIONS: AWAIT BIOPSIES HIGH FIBER DIET TCS IN 10 YEARS  REPEAT EXAM:  No  ______________________________ Jonette Eva, MD  CC:  Karleen Hampshire, M.D.  n. eSIGNED:   Rome Schlauch at 02/12/2012 10:20 AM  Lenise Arena, [phone]

## 2012-02-12 NOTE — Discharge Instructions (Signed)
You had 8 small polyps removed. You have small internal hemorrhoids.   FOLLOW A HIGH FIBER DIET. AVOID ITEMS THAT CAUSE BLOATING. SEE INFO BELOW.  YOUR BIOPSY RESULTS SHOULD BE BACK IN 7 DAYS.  Next colonoscopy in 10 years.   Colonoscopy Care After Read the instructions outlined below and refer to this sheet in the next week. These discharge instructions provide you with general information on caring for yourself after you leave the hospital. While your treatment has been planned according to the most current medical practices available, unavoidable complications occasionally occur. If you have any problems or questions after discharge, call DR. Anirudh Baiz, [phone].  ACTIVITY  You may resume your regular activity, but move at a slower pace for the next 24 hours.   Take frequent rest periods for the next 24 hours.   Walking will help get rid of the air and reduce the bloated feeling in your belly (abdomen).   No driving for 24 hours (because of the medicine (anesthesia) used during the test).   You may shower.   Do not sign any important legal documents or operate any machinery for 24 hours (because of the anesthesia used during the test).    NUTRITION  Drink plenty of fluids.   You may resume your normal diet as instructed by your doctor.   Begin with a light meal and progress to your normal diet. Heavy or fried foods are harder to digest and may make you feel sick to your stomach (nauseated).   Avoid alcoholic beverages for 24 hours or as instructed.    MEDICATIONS  You may resume your normal medications.   WHAT YOU CAN EXPECT TODAY  Some feelings of bloating in the abdomen.   Passage of more gas than usual.   Spotting of blood in your stool or on the toilet paper  .  IF YOU HAD POLYPS REMOVED DURING THE COLONOSCOPY:  Eat a soft diet IF YOU HAVE NAUSEA, BLOATING, ABDOMINAL PAIN, OR VOMITING.    FINDING OUT THE RESULTS OF YOUR TEST Not all test results are  available during your visit. DR. Darrick Penna WILL CALL YOU WITHIN 7 DAYS OF YOUR PROCEDUE WITH YOUR RESULTS. Do not assume everything is normal if you have not heard from DR. Tykeisha Peer IN ONE WEEK, CALL HER OFFICE AT [phone].  SEEK IMMEDIATE MEDICAL ATTENTION AND CALL THE OFFICE: [phone] IF:  You have more than a spotting of blood in your stool.   Your belly is swollen (abdominal distention).   You are nauseated or vomiting.   You have a temperature over 101F.   You have abdominal pain or discomfort that is severe or gets worse throughout the day.  Polyps, Colon  A polyp is extra tissue that grows inside your body. Colon polyps grow in the large intestine. The large intestine, also called the colon, is part of your digestive system. It is a long, hollow tube at the end of your digestive tract where your body makes and stores stool. Most polyps are not dangerous. They are benign. This means they are not cancerous. But over time, some types of polyps can turn into cancer. Polyps that are smaller than a pea are usually not harmful. But larger polyps could someday become or may already be cancerous. To be safe, doctors remove all polyps and test them.   WHO GETS POLYPS? Anyone can get polyps, but certain people are more likely than others. You may have a greater chance of getting polyps if:  You are  over 50.   You have had polyps before.   Someone in your family has had polyps.   Someone in your family has had cancer of the large intestine.   Find out if someone in your family has had polyps. You may also be more likely to get polyps if you:   Eat a lot of fatty foods   Smoke   Drink alcohol   Do not exercise  Eat too much   TREATMENT  The caregiver will remove the polyp during sigmoidoscopy or colonoscopy.  PREVENTION There is not one sure way to prevent polyps. You might be able to lower your risk of getting them if you:  Eat more fruits and vegetables and less fatty food.     Do not smoke.   Avoid alcohol.   Exercise every day.   Lose weight if you are overweight.   Eating more calcium and folate can also lower your risk of getting polyps. Some foods that are rich in calcium are milk, cheese, and broccoli. Some foods that are rich in folate are chickpeas, kidney beans, and spinach.   High-Fiber Diet A high-fiber diet changes your normal diet to include more whole grains, legumes, fruits, and vegetables. Changes in the diet involve replacing refined carbohydrates with unrefined foods. The calorie level of the diet is essentially unchanged. The Dietary Reference Intake (recommended amount) for adult males is 38 grams per day. For adult females, it is 25 grams per day. Pregnant and lactating women should consume 28 grams of fiber per day. Fiber is the intact part of a plant that is not broken down during digestion. Functional fiber is fiber that has been isolated from the plant to provide a beneficial effect in the body. PURPOSE  Increase stool bulk.   Ease and regulate bowel movements.   Lower cholesterol.  INDICATIONS THAT YOU NEED MORE FIBER  Constipation and hemorrhoids.   Uncomplicated diverticulosis (intestine condition) and irritable bowel syndrome.   Weight management.   As a protective measure against hardening of the arteries (atherosclerosis), diabetes, and cancer.   GUIDELINES FOR INCREASING FIBER IN THE DIET  Start adding fiber to the diet slowly. A gradual increase of about 5 more grams (2 slices of whole-wheat bread, 2 servings of most fruits or vegetables, or 1 bowl of high-fiber cereal) per day is best. Too rapid an increase in fiber may result in constipation, flatulence, and bloating.   Drink enough water and fluids to keep your urine clear or pale yellow. Water, juice, or caffeine-free drinks are recommended. Not drinking enough fluid may cause constipation.   Eat a variety of high-fiber foods rather than one type of fiber.   Try  to increase your intake of fiber through using high-fiber foods rather than fiber pills or supplements that contain small amounts of fiber.   The goal is to change the types of food eaten. Do not supplement your present diet with high-fiber foods, but replace foods in your present diet.  INCLUDE A VARIETY OF FIBER SOURCES  Replace refined and processed grains with whole grains, canned fruits with fresh fruits, and incorporate other fiber sources. White rice, white breads, and most bakery goods contain little or no fiber.   Brown whole-grain rice, buckwheat oats, and many fruits and vegetables are all good sources of fiber. These include: broccoli, Brussels sprouts, cabbage, cauliflower, beets, sweet potatoes, white potatoes (skin on), carrots, tomatoes, eggplant, squash, berries, fresh fruits, and dried fruits.   Cereals appear to be the  richest source of fiber. Cereal fiber is found in whole grains and bran. Bran is the fiber-rich outer coat of cereal grain, which is largely removed in refining. In whole-grain cereals, the bran remains. In breakfast cereals, the largest amount of fiber is found in those with "bran" in their names. The fiber content is sometimes indicated on the label.   You may need to include additional fruits and vegetables each day.   In baking, for 1 cup white flour, you may use the following substitutions:   1 cup whole-wheat flour minus 2 tablespoons.   1/2 cup white flour plus 1/2 cup whole-wheat flour.   Hemorrhoids Hemorrhoids are dilated (enlarged) veins around the rectum. Sometimes clots will form in the veins. This makes them swollen and painful. These are called thrombosed hemorrhoids. Causes of hemorrhoids include:  Constipation.   Straining to have a bowel movement.   HEAVY LIFTING HOME CARE INSTRUCTIONS  Eat a well balanced diet and drink 6 to 8 glasses of water every day to avoid constipation. You may also use a bulk laxative.   Avoid straining to  have bowel movements.   Keep anal area dry and clean.   Do not use a donut shaped pillow or sit on the toilet for long periods. This increases blood pooling and pain.   Move your bowels when your body has the urge; this will require less straining and will decrease pain and pressure.

## 2012-02-12 NOTE — H&P (Signed)
  Primary Care Physician:  Kirk Ruths, MD, MD Primary Gastroenterologist:  Dr. Darrick Penna  Pre-Procedure History & Physical: HPI:  Beverly Mccann is a 51 y.o. female here for COLON CANCER SCREENING.   Past Medical History  Diagnosis Date  . Hypertension   . Hypercholesteremia     Past Surgical History  Procedure Date  . Abdominal hysterectomy   . Cyst removed from right breast     Prior to Admission medications   Medication Sig Start Date End Date Taking? Authorizing Provider  amLODipine (NORVASC) 10 MG tablet Take 0.5 tablets (5 mg total) by mouth daily. 08/06/11 08/05/12  Joya Gaskins, MD  aspirin 81 MG tablet Take 81 mg by mouth daily.    Historical Provider, MD  benazepril (LOTENSIN) 10 MG tablet Take 40 mg by mouth daily. 08/06/11 08/05/12  Joya Gaskins, MD  hydrochlorothiazide (HYDRODIURIL) 25 MG tablet Take 25 mg by mouth daily.    Historical Provider, MD  pravastatin (PRAVACHOL) 20 MG tablet Take 20 mg by mouth at bedtime.    Historical Provider, MD    Allergies as of 01/17/2012  . (No Known Allergies)    No family history on file.  History   Social History  . Marital Status: Married    Spouse Name: N/A    Number of Children: N/A  . Years of Education: N/A   Occupational History  . Not on file.   Social History Main Topics  . Smoking status: Former Smoker -- 0.5 packs/day for 15 years    Types: Cigarettes    Quit date: 11/12/2011  . Smokeless tobacco: Not on file  . Alcohol Use: Yes     occasionally  . Drug Use: No  . Sexually Active:    Other Topics Concern  . Not on file   Social History Narrative  . No narrative on file    Review of Systems: See HPI, otherwise negative ROS   Physical Exam: BP 158/81  Pulse 78  Temp(Src) 98.4 F (36.9 C) (Oral)  Resp 22  Ht 5\' 3"  (1.6 m)  Wt 235 lb (106.595 kg)  BMI 41.63 kg/m2  SpO2 97% General:   Alert,  pleasant and cooperative in NAD Head:  Normocephalic and atraumatic. Neck:  Supple;    Lungs:  Clear throughout to auscultation.    Heart:  Regular rate and rhythm. Abdomen:  Soft, nontender and nondistended. Normal bowel sounds, without guarding, and without rebound.   Neurologic:  Alert and  oriented x4;  grossly normal neurologically.  Impression/Plan:     SCREENING  Plan:  1. TCS TODAY

## 2012-02-13 ENCOUNTER — Telehealth: Payer: Self-pay | Admitting: Gastroenterology

## 2012-02-13 NOTE — Telephone Encounter (Signed)
Results Cc to PCP and reminder is in the computer

## 2012-02-13 NOTE — Telephone Encounter (Signed)
Please call pt. She had HYPERPLASTIC POLYPS removed from her colon. High fiber diet. TCS in 10 years.   

## 2012-02-13 NOTE — Telephone Encounter (Signed)
Tried to call with no answer  

## 2012-02-14 ENCOUNTER — Encounter (HOSPITAL_COMMUNITY): Payer: Self-pay | Admitting: Gastroenterology

## 2012-02-14 NOTE — Telephone Encounter (Signed)
Called. Could not leave a message.  

## 2012-02-15 NOTE — Telephone Encounter (Signed)
Mailed letter for pt to call.

## 2012-02-19 ENCOUNTER — Telehealth: Payer: Self-pay

## 2012-02-19 NOTE — Telephone Encounter (Signed)
Pt called for results. ( See 02/13/2012 note). She was informed and High fiber diet mailed to pt.

## 2012-08-31 ENCOUNTER — Emergency Department (HOSPITAL_COMMUNITY)
Admission: EM | Admit: 2012-08-31 | Discharge: 2012-08-31 | Disposition: A | Discharge status: Home / Self Care | Payer: BC Managed Care – PPO | Attending: Emergency Medicine | Admitting: Emergency Medicine

## 2012-08-31 ENCOUNTER — Encounter (HOSPITAL_COMMUNITY): Payer: Self-pay | Admitting: *Deleted

## 2012-08-31 DIAGNOSIS — I10 Essential (primary) hypertension: Secondary | ICD-10-CM | POA: Insufficient documentation

## 2012-08-31 DIAGNOSIS — T4995XA Adverse effect of unspecified topical agent, initial encounter: Secondary | ICD-10-CM | POA: Insufficient documentation

## 2012-08-31 DIAGNOSIS — E7800 Pure hypercholesterolemia, unspecified: Secondary | ICD-10-CM | POA: Insufficient documentation

## 2012-08-31 DIAGNOSIS — T7840XA Allergy, unspecified, initial encounter: Secondary | ICD-10-CM | POA: Insufficient documentation

## 2012-08-31 DIAGNOSIS — Z87891 Personal history of nicotine dependence: Secondary | ICD-10-CM | POA: Insufficient documentation

## 2012-08-31 MED ORDER — FAMOTIDINE 20 MG PO TABS: 20.0000 mg | ORAL_TABLET | Freq: Two times a day (BID) | ORAL | Status: DC

## 2012-08-31 MED ORDER — PREDNISONE 10 MG PO TABS: 20.0000 mg | ORAL_TABLET | Freq: Every day | ORAL | Status: DC

## 2012-08-31 MED ORDER — DIPHENHYDRAMINE HCL 50 MG/ML IJ SOLN
50.0000 mg | Freq: Once | INTRAMUSCULAR | Status: AC
  Administered 2012-08-31: 50 mg via INTRAVENOUS
  Filled 2012-08-31: qty 1

## 2012-08-31 MED ORDER — METHYLPREDNISOLONE SODIUM SUCC 125 MG IJ SOLR
125.0000 mg | Freq: Once | INTRAMUSCULAR | Status: AC
  Administered 2012-08-31: 125 mg via INTRAVENOUS
  Filled 2012-08-31: qty 2

## 2012-08-31 MED ORDER — FAMOTIDINE IN NACL 20-0.9 MG/50ML-% IV SOLN
20.0000 mg | Freq: Once | INTRAVENOUS | Status: AC
  Administered 2012-08-31: 20 mg via INTRAVENOUS
  Filled 2012-08-31: qty 50

## 2012-08-31 NOTE — ED Notes (Signed)
Pt has a rash and whelps on arms, neck, and under chin.

## 2012-08-31 NOTE — ED Notes (Signed)
Pt complains of rash to upper chest, neck, and arms. States it has been going on for 2 days and has gotten worse and itching. Pt denies SOB, Airway clear lungs clear. Denies new soaps, detergents, meds, etc.

## 2012-08-31 NOTE — ED Provider Notes (Signed)
History     CSN: [id number]  Arrival date & time 08/31/12  1945   First MD Initiated Contact with Patient 08/31/12 2009      Chief Complaint  Patient presents with  . Allergic Reaction    (Consider location/radiation/quality/duration/timing/severity/associated sxs/prior treatment) Patient is a 51 y.o. female presenting with allergic reaction. The history is provided by the patient (the pt complains of a rash for a couple days). No language interpreter was used.  Allergic Reaction The primary symptoms are  rash. The primary symptoms do not include shortness of breath, cough, abdominal pain or diarrhea. The current episode started more than 2 days ago. The problem has not changed since onset.This is a new problem.  Associated with: nothing. Significant symptoms that are not present include eye redness.    Past Medical History  Diagnosis Date  . Hypertension   . Hypercholesteremia     Past Surgical History  Procedure Date  . Abdominal hysterectomy   . Cyst removed from right breast   . Colonoscopy 02/12/2012    Procedure: COLONOSCOPY;  Surgeon: West Bali, MD;  Location: [facility];  Service: Endoscopy;  Laterality: N/A;  10:30 AM    History reviewed. No pertinent family history.  History  Substance Use Topics  . Smoking status: Former Smoker -- 0.5 packs/day for 15 years    Types: Cigarettes    Quit date: 11/12/2011  . Smokeless tobacco: Not on file  . Alcohol Use: Yes     Comment: occasionally    OB History    Grav Para Term Preterm Abortions TAB SAB Ect Mult Living                  Review of Systems  Constitutional: Negative for fatigue.  HENT: Negative for congestion, sinus pressure and ear discharge.   Eyes: Negative for discharge and redness.  Respiratory: Negative for cough and shortness of breath.   Cardiovascular: Negative for chest pain.  Gastrointestinal: Negative for abdominal pain and diarrhea.  Genitourinary: Negative for frequency and  hematuria.  Musculoskeletal: Negative for back pain.  Skin: Positive for rash.  Neurological: Negative for seizures and headaches.  Hematological: Negative.   Psychiatric/Behavioral: Negative for hallucinations.    Allergies  Review of patient's allergies indicates no known allergies.  Home Medications   Current Outpatient Rx  Name  Route  Sig  Dispense  Refill  . ASPIRIN EC 81 MG PO TBEC   Oral   Take 81 mg by mouth daily.         Marland Kitchen BENAZEPRIL HCL 10 MG PO TABS   Oral   Take 40 mg by mouth daily.         Marland Kitchen HYDROCHLOROTHIAZIDE 25 MG PO TABS   Oral   Take 25 mg by mouth daily.         Marland Kitchen PRAVASTATIN SODIUM 20 MG PO TABS   Oral   Take 20 mg by mouth at bedtime.         Marland Kitchen AMLODIPINE BESYLATE 10 MG PO TABS   Oral   Take 0.5 tablets (5 mg total) by mouth daily.   30 tablet   0   . BENAZEPRIL HCL 10 MG PO TABS   Oral   Take 40 mg by mouth daily.         Marland Kitchen FAMOTIDINE 20 MG PO TABS   Oral   Take 1 tablet (20 mg total) by mouth 2 (two) times daily.   15 tablet  0   . PREDNISONE 10 MG PO TABS   Oral   Take 2 tablets (20 mg total) by mouth daily.   15 tablet   0     BP 145/88  Pulse 114  Temp 98.1 F (36.7 C) (Oral)  Resp 20  Ht 5\' 4"  (1.626 m)  Wt 247 lb (112.038 kg)  BMI 42.40 kg/m2  SpO2 100%  Physical Exam  Constitutional: She is oriented to person, place, and time. She appears well-developed.  HENT:  Head: Normocephalic and atraumatic.  Eyes: Conjunctivae normal and EOM are normal. No scleral icterus.  Neck: Neck supple. No thyromegaly present.  Cardiovascular: Normal rate and regular rhythm.  Exam reveals no gallop and no friction rub.   No murmur heard. Pulmonary/Chest: No stridor. She has no wheezes. She has no rales. She exhibits no tenderness.  Abdominal: She exhibits no distension. There is no tenderness. There is no rebound.  Musculoskeletal: Normal range of motion. She exhibits no edema.  Lymphadenopathy:    She has no cervical  adenopathy.  Neurological: She is oriented to person, place, and time. Coordination normal.  Skin: Rash noted. No erythema.       Pt has an allergic rash to torso and extremities  Psychiatric: She has a normal mood and affect. Her behavior is normal.    ED Course  Procedures (including critical care time)  Labs Reviewed - No data to display No results found.   1. Allergic reaction     Pt improved with tx  MDM          Benny Lennert, MD 08/31/12 2125

## 2012-09-16 ENCOUNTER — Other Ambulatory Visit (HOSPITAL_COMMUNITY): Payer: Self-pay | Admitting: Family Medicine

## 2012-09-16 DIAGNOSIS — Z139 Encounter for screening, unspecified: Secondary | ICD-10-CM

## 2012-09-20 ENCOUNTER — Ambulatory Visit (HOSPITAL_COMMUNITY)
Admission: RE | Admit: 2012-09-20 | Discharge: 2012-09-20 | Disposition: A | Discharge status: Home / Self Care | Payer: BC Managed Care – PPO | Source: Ambulatory Visit | Attending: Family Medicine | Admitting: Family Medicine

## 2012-09-20 DIAGNOSIS — Z1231 Encounter for screening mammogram for malignant neoplasm of breast: Secondary | ICD-10-CM | POA: Insufficient documentation

## 2012-09-20 DIAGNOSIS — Z139 Encounter for screening, unspecified: Secondary | ICD-10-CM

## 2016-06-15 ENCOUNTER — Encounter (HOSPITAL_COMMUNITY): Payer: Self-pay | Admitting: *Deleted

## 2016-06-15 ENCOUNTER — Emergency Department (HOSPITAL_COMMUNITY)
Admission: EM | Admit: 2016-06-15 | Discharge: 2016-06-15 | Disposition: A | Discharge status: Home / Self Care | Payer: Managed Care, Other (non HMO) | Attending: Emergency Medicine | Admitting: Emergency Medicine

## 2016-06-15 DIAGNOSIS — I10 Essential (primary) hypertension: Secondary | ICD-10-CM | POA: Insufficient documentation

## 2016-06-15 DIAGNOSIS — Z87891 Personal history of nicotine dependence: Secondary | ICD-10-CM | POA: Diagnosis not present

## 2016-06-15 DIAGNOSIS — Z791 Long term (current) use of non-steroidal anti-inflammatories (NSAID): Secondary | ICD-10-CM | POA: Diagnosis not present

## 2016-06-15 DIAGNOSIS — Z79899 Other long term (current) drug therapy: Secondary | ICD-10-CM | POA: Diagnosis not present

## 2016-06-15 DIAGNOSIS — G5603 Carpal tunnel syndrome, bilateral upper limbs: Secondary | ICD-10-CM | POA: Diagnosis not present

## 2016-06-15 DIAGNOSIS — M79641 Pain in right hand: Secondary | ICD-10-CM | POA: Diagnosis present

## 2016-06-15 MED ORDER — IBUPROFEN 600 MG PO TABS: 600.0000 mg | ORAL_TABLET | Freq: Four times a day (QID) | ORAL | 0 refills | Status: DC

## 2016-06-15 NOTE — ED Triage Notes (Signed)
Pt c/o bilateral hand pain that gets worse at night

## 2016-06-15 NOTE — ED Provider Notes (Signed)
[facility] DEPT Provider Note   CSN: [id number] Arrival date & time: 06/15/16  0440     History   Chief Complaint Chief Complaint  Patient presents with  . Hand Pain    HPI Beverly Mccann is a 55 y.o. female.  HPI Pt comes in with cc of bilateral hand pain. Pt's pain has been present for few days now. She reports pain to the palms and some numbness. Her symptoms are worse at night, and wake her up from the sleep. 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 No neck pain, elbow pain.  Past Medical History:  Diagnosis Date  . Hypercholesteremia   . Hypertension     Patient Active Problem List   Diagnosis Date Noted  . BRONCHITIS, ACUTE 10/26/2008  . MYALGIA 10/26/2008  . ANEMIA, NORMOCYTIC 10/25/2007  . HYPERLIPIDEMIA 08/18/2006  . OBESITY NOS 08/18/2006  . HYPERTENSION 08/18/2006  . ECZEMA 08/18/2006  . OSTEOARTHRITIS 08/18/2006  . HYPERGLYCEMIA 08/18/2006    Past Surgical History:  Procedure Laterality Date  . ABDOMINAL HYSTERECTOMY    . COLONOSCOPY  02/12/2012   Procedure: COLONOSCOPY;  Surgeon: Danie Binder, MD;  Location: [facility];  Service: Endoscopy;  Laterality: N/A;  10:30 AM  . Cyst removed from right breast      OB History    No data available       Home Medications    Prior to Admission medications   Medication Sig Start Date End Date Taking? Authorizing Provider  amLODipine (NORVASC) 10 MG tablet Take 0.5 tablets (5 mg total) by mouth daily. 08/06/11 06/15/16 Yes Ripley Fraise, MD  famotidine (PEPCID) 20 MG tablet Take 1 tablet (20 mg total) by mouth 2 (two) times daily. 08/31/12  Yes Milton Ferguson, MD  ibuprofen (ADVIL,MOTRIN) 600 MG tablet Take 1 tablet (600 mg total) by mouth 4 (four) times daily. 06/15/16   Varney Biles, MD    Family History History reviewed. No pertinent family history.  Social  History Social History  Substance Use Topics  . Smoking status: Former Smoker    Packs/day: 0.50    Years: 15.00    Types: Cigarettes    Quit date: 11/12/2011  . Smokeless tobacco: Never Used  . Alcohol use Yes     Comment: occasionally     Allergies   Review of patient's allergies indicates no known allergies.   Review of Systems Review of Systems  Constitutional: Positive for activity change.  Musculoskeletal: Negative for arthralgias, joint swelling, myalgias and neck pain.  Skin: Negative for rash and wound.  Neurological: Positive for numbness.     Physical Exam Updated Vital Signs BP 154/94 (BP Location: Left Arm)   Pulse 65   Temp 97.8 F (36.6 C) (Oral)   Resp 18   Ht 5\' 4"  (1.626 m)   Wt 224 lb (101.6 kg)   SpO2 99%   BMI 38.45 kg/m   Physical Exam  Constitutional: She is oriented to person, place, and time. She appears well-developed.  HENT:  Head: Normocephalic and atraumatic.  Eyes: EOM are normal.  Neck: Normal range of motion. Neck supple.  Cardiovascular: Normal rate.   Pulmonary/Chest: Effort normal.  Abdominal: Bowel sounds are normal.  Musculoskeletal:  + phelan signs Pt has more generalized numbness over the digits in both her hands, L worse than R, Neg tinnel's sign  Neurological: She is alert and oriented to person, place, and time.  Skin: Skin is warm and dry.  Nursing note and vitals reviewed.    ED Treatments / Results  Labs (all labs ordered are listed, but only abnormal results are displayed) Labs Reviewed - No data to display  EKG  EKG Interpretation None       Radiology No results found.  Procedures Procedures (including critical care time)  Medications Ordered in ED Medications - No data to display   Initial Impression / Assessment and Plan / ED Course  I have reviewed the triage vital signs and the nursing notes.  Pertinent labs & imaging results that were available during my care of the patient were  reviewed by me and considered in my medical decision making (see chart for details).  Clinical Course   Pt likely has carpal tunnels based on hx and exam. Hand f/u given for optimal care.  Final Clinical Impressions(s) / ED Diagnoses   Final diagnoses:  Bilateral carpal tunnel syndrome    New Prescriptions Discharge Medication List as of 06/15/2016  5:40 AM    START taking these medications   Details  ibuprofen (ADVIL,MOTRIN) 600 MG tablet Take 1 tablet (600 mg total) by mouth 4 (four) times daily., Starting Thu 06/15/2016, Print         Varney Biles, MD 06/16/16 [phone]

## 2016-06-15 NOTE — Discharge Instructions (Signed)
We suspect that you have carpal tunnel syndrome. We recommend that you wear the splint every night when you get to bed. You may wear the splint during the daytime as well if it Is helpful.  See the hand doctor.

## 2016-06-21 ENCOUNTER — Ambulatory Visit (INDEPENDENT_AMBULATORY_CARE_PROVIDER_SITE_OTHER): Payer: Managed Care, Other (non HMO) | Admitting: Orthopedic Surgery

## 2016-06-21 ENCOUNTER — Encounter: Payer: Self-pay | Admitting: Orthopedic Surgery

## 2016-06-21 VITALS — BP 160/90 | HR 66 | Wt 225.0 lb

## 2016-06-21 DIAGNOSIS — G5603 Carpal tunnel syndrome, bilateral upper limbs: Secondary | ICD-10-CM | POA: Diagnosis not present

## 2016-06-21 MED ORDER — GABAPENTIN 100 MG PO CAPS: 100.0000 mg | ORAL_CAPSULE | Freq: Three times a day (TID) | ORAL | 1 refills | Status: DC

## 2016-06-21 NOTE — Progress Notes (Signed)
Patient ID: Beverly Mccann, female   DOB: 02/13/1961, 55 y.o.   MRN: [id number]  Chief Complaint  Patient presents with  . Hand Pain    bilateral hand pain with numbness and tingling    HPI Beverly Mccann is a 55 y.o. female.  Presents for evaluation of numbness and tingling of both hands Chief Complaint  Patient presents with  . Hand Pain    bilateral hand pain with numbness and tingling    The patient presented to the emergency room complaining of numbness and tingling both hands with night pain for several weeks. Pain is severe at night numbness and tingling seem to be worse at night as well. She does well during the day. She's not had any treatment.   Review of Systems Review of Systems  Respiratory: Negative for shortness of breath.   Cardiovascular: Negative for chest pain.     Past Medical History:  Diagnosis Date  . Hypercholesteremia   . Hypertension     Past Surgical History:  Procedure Laterality Date  . ABDOMINAL HYSTERECTOMY    . COLONOSCOPY  02/12/2012   Procedure: COLONOSCOPY;  Surgeon: Danie Binder, MD;  Location: [facility];  Service: Endoscopy;  Laterality: N/A;  10:30 AM  . Cyst removed from right breast      No family history on file.  Social History Social History  Substance Use Topics  . Smoking status: Former Smoker    Packs/day: 0.50    Years: 15.00    Types: Cigarettes    Quit date: 11/12/2011  . Smokeless tobacco: Never Used  . Alcohol use Yes     Comment: occasionally    No Known Allergies  Current Outpatient Prescriptions  Medication Sig Dispense Refill  . benazepril (LOTENSIN) 40 MG tablet Take 40 mg by mouth daily.    . hydrochlorothiazide (HYDRODIURIL) 25 MG tablet Take 25 mg by mouth daily.    Marland Kitchen gabapentin (NEURONTIN) 100 MG capsule Take 1 capsule (100 mg total) by mouth 3 (three) times daily. 90 capsule 1   No current facility-administered medications for this visit.      Physical Exam Blood pressure (!) 160/90, pulse  66, weight 225 lb (102.1 kg). Physical Exam The patient is well developed well nourished and well groomed.  Orientation to person place and time is normal  Mood is pleasant.  Ambulatory status Normal walking ability  Right and left Upper extremity examination reveals the following:  Inspection reveals no swelling. There is tenderness over the carpal tunnel.  Range of motion of the wrist and elbow are normal  Motor exam shows mild weakness with grip strength.  Wrist joint is stable  Provocative tests for carpal tunnel Phalen's test  positive  Carpal tunnel compression test negative  Pulses are normal in the radial and ulnar artery with a normal Allen's test.  Decreased sensation is noted in the median nerve distribution. Soft touch is normal.  Opposite extremity  Data Reviewed  independent image interpretation :  No imaging  Assessment    Bilateral CTS    Plan     Wrist splint at night Gabapentin  Vit B 6  Return 6 weeks

## 2016-06-21 NOTE — Patient Instructions (Signed)
B6 100mg  twice daily- this is an over the counter medication  Prescription for Gabapentin 100mg  three times daily will be sent to your pharmacy  Wear braces x 6 weeks at bedtime

## 2016-08-02 ENCOUNTER — Ambulatory Visit: Payer: Managed Care, Other (non HMO) | Admitting: Orthopedic Surgery

## 2016-08-05 ENCOUNTER — Emergency Department (HOSPITAL_COMMUNITY)
Admission: EM | Admit: 2016-08-05 | Discharge: 2016-08-05 | Disposition: A | Discharge status: Home / Self Care | Payer: Managed Care, Other (non HMO) | Attending: Emergency Medicine | Admitting: Emergency Medicine

## 2016-08-05 ENCOUNTER — Encounter (HOSPITAL_COMMUNITY): Payer: Self-pay | Admitting: Emergency Medicine

## 2016-08-05 DIAGNOSIS — Y9389 Activity, other specified: Secondary | ICD-10-CM | POA: Diagnosis not present

## 2016-08-05 DIAGNOSIS — Y92410 Unspecified street and highway as the place of occurrence of the external cause: Secondary | ICD-10-CM | POA: Diagnosis not present

## 2016-08-05 DIAGNOSIS — Y999 Unspecified external cause status: Secondary | ICD-10-CM | POA: Diagnosis not present

## 2016-08-05 DIAGNOSIS — I10 Essential (primary) hypertension: Secondary | ICD-10-CM | POA: Diagnosis not present

## 2016-08-05 DIAGNOSIS — Z87891 Personal history of nicotine dependence: Secondary | ICD-10-CM | POA: Insufficient documentation

## 2016-08-05 DIAGNOSIS — S3992XA Unspecified injury of lower back, initial encounter: Secondary | ICD-10-CM | POA: Diagnosis present

## 2016-08-05 DIAGNOSIS — Z79899 Other long term (current) drug therapy: Secondary | ICD-10-CM | POA: Insufficient documentation

## 2016-08-05 DIAGNOSIS — M545 Low back pain: Secondary | ICD-10-CM | POA: Insufficient documentation

## 2016-08-05 DIAGNOSIS — M7918 Myalgia, other site: Secondary | ICD-10-CM

## 2016-08-05 MED ORDER — METHOCARBAMOL 500 MG PO TABS: 500.0000 mg | ORAL_TABLET | Freq: Two times a day (BID) | ORAL | 0 refills | Status: DC

## 2016-08-05 MED ORDER — IBUPROFEN 600 MG PO TABS: 600.0000 mg | ORAL_TABLET | Freq: Four times a day (QID) | ORAL | 0 refills | Status: DC | PRN

## 2016-08-05 MED ORDER — METHOCARBAMOL 500 MG PO TABS
500.0000 mg | ORAL_TABLET | Freq: Once | ORAL | Status: AC
  Administered 2016-08-05: 500 mg via ORAL
  Filled 2016-08-05: qty 1

## 2016-08-05 MED ORDER — IBUPROFEN 400 MG PO TABS
400.0000 mg | ORAL_TABLET | Freq: Once | ORAL | Status: AC
  Administered 2016-08-05: 400 mg via ORAL
  Filled 2016-08-05: qty 1
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
## 2016-10-12 ENCOUNTER — Other Ambulatory Visit (HOSPITAL_COMMUNITY): Payer: Self-pay | Admitting: Registered Nurse

## 2016-10-12 ENCOUNTER — Ambulatory Visit (HOSPITAL_COMMUNITY)
Admission: RE | Admit: 2016-10-12 | Discharge: 2016-10-12 | Disposition: A | Discharge status: Home / Self Care | Payer: Managed Care, Other (non HMO) | Source: Ambulatory Visit | Attending: Registered Nurse | Admitting: Registered Nurse

## 2016-10-12 DIAGNOSIS — R52 Pain, unspecified: Secondary | ICD-10-CM

## 2016-10-12 DIAGNOSIS — M25551 Pain in right hip: Secondary | ICD-10-CM | POA: Insufficient documentation

## 2017-07-12 ENCOUNTER — Ambulatory Visit: Payer: Managed Care, Other (non HMO) | Admitting: Nutrition

## 2017-10-08 ENCOUNTER — Other Ambulatory Visit: Payer: Self-pay | Admitting: Physician Assistant

## 2017-10-08 DIAGNOSIS — M533 Sacrococcygeal disorders, not elsewhere classified: Secondary | ICD-10-CM

## 2017-10-19 ENCOUNTER — Ambulatory Visit
Admission: RE | Admit: 2017-10-19 | Discharge: 2017-10-19 | Disposition: A | Discharge status: Home / Self Care | Payer: Managed Care, Other (non HMO) | Source: Ambulatory Visit | Attending: Physician Assistant | Admitting: Physician Assistant

## 2017-10-19 DIAGNOSIS — M533 Sacrococcygeal disorders, not elsewhere classified: Secondary | ICD-10-CM

## 2018-02-05 ENCOUNTER — Other Ambulatory Visit (HOSPITAL_COMMUNITY): Payer: Self-pay | Admitting: Physician Assistant

## 2018-02-05 DIAGNOSIS — Z1231 Encounter for screening mammogram for malignant neoplasm of breast: Secondary | ICD-10-CM

## 2018-02-15 ENCOUNTER — Encounter (HOSPITAL_COMMUNITY): Payer: Self-pay

## 2018-02-15 ENCOUNTER — Ambulatory Visit (HOSPITAL_COMMUNITY)
Admission: RE | Admit: 2018-02-15 | Discharge: 2018-02-15 | Disposition: A | Discharge status: Home / Self Care | Payer: Managed Care, Other (non HMO) | Source: Ambulatory Visit | Attending: Physician Assistant | Admitting: Physician Assistant

## 2018-02-15 DIAGNOSIS — Z1231 Encounter for screening mammogram for malignant neoplasm of breast: Secondary | ICD-10-CM | POA: Insufficient documentation

## 2018-11-18 ENCOUNTER — Other Ambulatory Visit: Payer: Self-pay

## 2018-11-18 ENCOUNTER — Encounter (HOSPITAL_COMMUNITY): Payer: Self-pay | Admitting: Emergency Medicine

## 2018-11-18 ENCOUNTER — Emergency Department (HOSPITAL_COMMUNITY)
Admission: EM | Admit: 2018-11-18 | Discharge: 2018-11-18 | Disposition: A | Discharge status: Home / Self Care | Payer: Managed Care, Other (non HMO) | Attending: Emergency Medicine | Admitting: Emergency Medicine

## 2018-11-18 DIAGNOSIS — Z79899 Other long term (current) drug therapy: Secondary | ICD-10-CM | POA: Insufficient documentation

## 2018-11-18 DIAGNOSIS — Z87891 Personal history of nicotine dependence: Secondary | ICD-10-CM | POA: Insufficient documentation

## 2018-11-18 DIAGNOSIS — I10 Essential (primary) hypertension: Secondary | ICD-10-CM | POA: Insufficient documentation

## 2018-11-18 DIAGNOSIS — Z7984 Long term (current) use of oral hypoglycemic drugs: Secondary | ICD-10-CM | POA: Insufficient documentation

## 2018-11-18 MED ORDER — BENAZEPRIL HCL 40 MG PO TABS: 40.0000 mg | ORAL_TABLET | Freq: Every day | ORAL | 1 refills | Status: DC

## 2018-11-18 MED ORDER — METFORMIN HCL 500 MG PO TABS: 500.0000 mg | ORAL_TABLET | Freq: Two times a day (BID) | ORAL | 1 refills | Status: DC

## 2018-11-18 MED ORDER — HYDROCHLOROTHIAZIDE 25 MG PO TABS: 25.0000 mg | ORAL_TABLET | Freq: Every day | ORAL | 1 refills | Status: DC

## 2018-11-18 NOTE — ED Provider Notes (Signed)
[facility] Provider Note   CSN: [id number] Arrival date & time: 11/18/18  1616    History   Chief Complaint Chief Complaint  Patient presents with  . Hypertension    HPI Beverly Mccann is a 58 y.o. female.     Patient recently ran out of her blood pressure medicine.  Patient complains of numbness at the tips of her fingers of the right hand only at night for 2 weeks.  No other complaints.  Normally takes benazepril 40 mg, hydrochlorothiazide 25 mg, metformin 500 mg daily.  No chest pain, dyspnea, neuro deficits, head ataxia, headache.  Severity is mild.     Past Medical History:  Diagnosis Date  . Hypercholesteremia   . Hypertension     Patient Active Problem List   Diagnosis Date Noted  . BRONCHITIS, ACUTE 10/26/2008  . MYALGIA 10/26/2008  . ANEMIA, NORMOCYTIC 10/25/2007  . HYPERLIPIDEMIA 08/18/2006  . OBESITY NOS 08/18/2006  . HYPERTENSION 08/18/2006  . ECZEMA 08/18/2006  . OSTEOARTHRITIS 08/18/2006  . HYPERGLYCEMIA 08/18/2006    Past Surgical History:  Procedure Laterality Date  . ABDOMINAL HYSTERECTOMY    . COLONOSCOPY  02/12/2012   Procedure: COLONOSCOPY;  Surgeon: Danie Binder, MD;  Location: [facility];  Service: Endoscopy;  Laterality: N/A;  10:30 AM  . Cyst removed from right breast       OB History    Gravida  3   Para  3   Term  3   Preterm      AB      Living  3     SAB      TAB      Ectopic      Multiple      Live Births               Home Medications    Prior to Admission medications   Medication Sig Start Date End Date Taking? Authorizing Provider  benazepril (LOTENSIN) 40 MG tablet Take 40 mg by mouth daily.    [provider]  benazepril (LOTENSIN) 40 MG tablet Take 1 tablet (40 mg total) by mouth daily. 11/18/18   Nat Christen, MD  gabapentin (NEURONTIN) 100 MG capsule Take 1 capsule (100 mg total) by mouth 3 (three) times daily. 06/21/16   Carole Civil, MD   hydrochlorothiazide (HYDRODIURIL) 25 MG tablet Take 25 mg by mouth daily.    [provider]  hydrochlorothiazide (HYDRODIURIL) 25 MG tablet Take 1 tablet (25 mg total) by mouth daily. 11/18/18   Nat Christen, MD  ibuprofen (ADVIL,MOTRIN) 600 MG tablet Take 1 tablet (600 mg total) by mouth every 6 (six) hours as needed. 08/05/16   Shary Decamp, PA-C  metFORMIN (GLUCOPHAGE) 500 MG tablet Take 1 tablet (500 mg total) by mouth 2 (two) times daily with a meal. 11/18/18   Nat Christen, MD  methocarbamol (ROBAXIN) 500 MG tablet Take 1 tablet (500 mg total) by mouth 2 (two) times daily. 08/05/16   Shary Decamp, PA-C    Family History Family History  Problem Relation Age of Onset  . Hypertension Mother   . Hypertension Father     Social History Social History   Tobacco Use  . Smoking status: Former Smoker    Packs/day: 0.50    Years: 15.00    Pack years: 7.50    Types: Cigarettes    Last attempt to quit: 11/12/2011    Years since quitting: 7.0  . Smokeless tobacco: Never Used  Substance Use Topics  . Alcohol use: Yes    Comment: occasionally  . Drug use: No     Allergies   Patient has no known allergies.   Review of Systems Review of Systems  All other systems reviewed and are negative.    Physical Exam Updated Vital Signs BP (!) 166/95 (BP Location: Right Arm)   Pulse 83   Temp 97.9 F (36.6 C) (Oral)   Resp 18   Ht 5\' 3"  (1.6 m)   Wt 106.6 kg   SpO2 99%   BMI 41.63 kg/m   Physical Exam Vitals signs and nursing note reviewed.  Constitutional:      Appearance: She is well-developed.  HENT:     Head: Normocephalic and atraumatic.  Eyes:     Conjunctiva/sclera: Conjunctivae normal.  Neck:     Musculoskeletal: Neck supple.  Musculoskeletal: Normal range of motion.  Skin:    General: Skin is warm and dry.  Neurological:     Mental Status: She is alert and oriented to person, place, and time.  Psychiatric:        Behavior: Behavior normal.      ED  Treatments / Results  Labs (all labs ordered are listed, but only abnormal results are displayed) Labs Reviewed - No data to display  EKG None  Radiology No results found.  Procedures Procedures (including critical care time)  Medications Ordered in ED Medications - No data to display   Initial Impression / Assessment and Plan / ED Course  I have reviewed the triage vital signs and the nursing notes.  Pertinent labs & imaging results that were available during my care of the patient were reviewed by me and considered in my medical decision making (see chart for details).        Patient needs her medication refilled.  No work-up necessary today.  Will refill her antihypertensive and diabetes medication.  Final Clinical Impressions(s) / ED Diagnoses   Final diagnoses:  Hypertension, unspecified type    ED Discharge Orders         Ordered    hydrochlorothiazide (HYDRODIURIL) 25 MG tablet  Daily     11/18/18 1735    benazepril (LOTENSIN) 40 MG tablet  Daily     11/18/18 1735    metFORMIN (GLUCOPHAGE) 500 MG tablet  2 times daily with meals     11/18/18 1735           Nat Christen, MD 11/18/18 [phone]

## 2018-11-18 NOTE — Discharge Instructions (Signed)
Medication E prescribed to your pharmacy.

## 2018-11-18 NOTE — ED Triage Notes (Signed)
Patient states she feels like her BP is high, has been out of her prescriptions for a few weeks. C/O numbness in the end of her fingers on her R hand, only at night x 2 weeks.

## 2019-04-03 ENCOUNTER — Other Ambulatory Visit (HOSPITAL_COMMUNITY): Payer: Self-pay | Admitting: Physician Assistant

## 2019-04-03 DIAGNOSIS — Z1231 Encounter for screening mammogram for malignant neoplasm of breast: Secondary | ICD-10-CM

## 2019-07-21 ENCOUNTER — Other Ambulatory Visit (HOSPITAL_COMMUNITY): Payer: Self-pay | Admitting: Family Medicine

## 2019-07-21 DIAGNOSIS — Z1231 Encounter for screening mammogram for malignant neoplasm of breast: Secondary | ICD-10-CM

## 2019-07-23 ENCOUNTER — Ambulatory Visit (HOSPITAL_COMMUNITY)
Admission: RE | Admit: 2019-07-23 | Discharge: 2019-07-23 | Disposition: A | Discharge status: Home / Self Care | Payer: BC Managed Care – PPO | Source: Ambulatory Visit | Attending: Family Medicine | Admitting: Family Medicine

## 2019-07-23 ENCOUNTER — Other Ambulatory Visit: Payer: Self-pay

## 2019-07-23 DIAGNOSIS — Z1231 Encounter for screening mammogram for malignant neoplasm of breast: Secondary | ICD-10-CM | POA: Diagnosis not present

## 2019-11-01 ENCOUNTER — Emergency Department (HOSPITAL_COMMUNITY)
Admission: EM | Admit: 2019-11-01 | Discharge: 2019-11-01 | Disposition: A | Discharge status: Home / Self Care | Payer: BC Managed Care – PPO | Attending: Emergency Medicine | Admitting: Emergency Medicine

## 2019-11-01 ENCOUNTER — Other Ambulatory Visit: Payer: Self-pay

## 2019-11-01 ENCOUNTER — Encounter (HOSPITAL_COMMUNITY): Payer: Self-pay

## 2019-11-01 DIAGNOSIS — Z87891 Personal history of nicotine dependence: Secondary | ICD-10-CM | POA: Diagnosis not present

## 2019-11-01 DIAGNOSIS — Z7984 Long term (current) use of oral hypoglycemic drugs: Secondary | ICD-10-CM | POA: Diagnosis not present

## 2019-11-01 DIAGNOSIS — G44209 Tension-type headache, unspecified, not intractable: Secondary | ICD-10-CM | POA: Diagnosis not present

## 2019-11-01 DIAGNOSIS — I10 Essential (primary) hypertension: Secondary | ICD-10-CM | POA: Insufficient documentation

## 2019-11-01 DIAGNOSIS — R519 Headache, unspecified: Secondary | ICD-10-CM | POA: Diagnosis present

## 2019-11-01 DIAGNOSIS — Z79899 Other long term (current) drug therapy: Secondary | ICD-10-CM | POA: Diagnosis not present

## 2019-11-01 MED ORDER — KETOROLAC TROMETHAMINE 30 MG/ML IJ SOLN
15.0000 mg | Freq: Once | INTRAMUSCULAR | Status: AC
  Administered 2019-11-01: 15 mg via INTRAMUSCULAR
  Filled 2019-11-01: qty 1

## 2019-11-01 NOTE — ED Provider Notes (Signed)
[facility] EMERGENCY DEPARTMENT Provider Note   CSN: [id number] Arrival date & time: 11/01/19  0257     History Chief Complaint  Patient presents with  . Headache    Beverly Mccann is a 59 y.o. female.  HPI     This is a 59 year old female with a history of hypertension, hypercholesterolemia who presents with headache.  Patient reports 3-day history of waxing and waning headache.  She states it is mostly in the left temporal region and is achy in nature.  She states that she takes Tylenol which does help at times.  She has no history of headaches or migraines.  She rates her pain at 5 out of 10.  She denies any associated vision changes, weakness, numbness, speech difficulty, strokelike symptoms.  Denies worst headache of her life.  Denies any upper respiratory symptoms or fevers.  Past Medical History:  Diagnosis Date  . Hypercholesteremia   . Hypertension     Patient Active Problem List   Diagnosis Date Noted  . BRONCHITIS, ACUTE 10/26/2008  . MYALGIA 10/26/2008  . ANEMIA, NORMOCYTIC 10/25/2007  . HYPERLIPIDEMIA 08/18/2006  . OBESITY NOS 08/18/2006  . HYPERTENSION 08/18/2006  . ECZEMA 08/18/2006  . OSTEOARTHRITIS 08/18/2006  . HYPERGLYCEMIA 08/18/2006    Past Surgical History:  Procedure Laterality Date  . ABDOMINAL HYSTERECTOMY    . COLONOSCOPY  02/12/2012   Procedure: COLONOSCOPY;  Surgeon: Danie Binder, MD;  Location: [facility];  Service: Endoscopy;  Laterality: N/A;  10:30 AM  . Cyst removed from right breast       OB History    Gravida  3   Para  3   Term  3   Preterm      AB      Living  3     SAB      TAB      Ectopic      Multiple      Live Births              Family History  Problem Relation Age of Onset  . Hypertension Mother   . Hypertension Father     Social History   Tobacco Use  . Smoking status: Former Smoker    Packs/day: 0.50    Years: 15.00    Pack years: 7.50    Types: Cigarettes    Quit date:  11/12/2011    Years since quitting: 7.9  . Smokeless tobacco: Never Used  Substance Use Topics  . Alcohol use: Yes    Comment: occasionally  . Drug use: No    Home Medications Prior to Admission medications   Medication Sig Start Date End Date Taking? Authorizing Provider  benazepril (LOTENSIN) 40 MG tablet Take 40 mg by mouth daily.    [provider]  benazepril (LOTENSIN) 40 MG tablet Take 1 tablet (40 mg total) by mouth daily. 11/18/18   Nat Christen, MD  gabapentin (NEURONTIN) 100 MG capsule Take 1 capsule (100 mg total) by mouth 3 (three) times daily. 06/21/16   Carole Civil, MD  hydrochlorothiazide (HYDRODIURIL) 25 MG tablet Take 25 mg by mouth daily.    [provider]  hydrochlorothiazide (HYDRODIURIL) 25 MG tablet Take 1 tablet (25 mg total) by mouth daily. 11/18/18   Nat Christen, MD  ibuprofen (ADVIL,MOTRIN) 600 MG tablet Take 1 tablet (600 mg total) by mouth every 6 (six) hours as needed. 08/05/16   Shary Decamp, PA-C  metFORMIN (GLUCOPHAGE) 500 MG tablet Take 1  tablet (500 mg total) by mouth 2 (two) times daily with a meal. 11/18/18   Nat Christen, MD  methocarbamol (ROBAXIN) 500 MG tablet Take 1 tablet (500 mg total) by mouth 2 (two) times daily. 08/05/16   Shary Decamp, PA-C    Allergies    Patient has no known allergies.  Review of Systems   Review of Systems  Constitutional: Negative for fever.  Eyes: Negative for photophobia and visual disturbance.  Respiratory: Negative for cough and shortness of breath.   Cardiovascular: Negative for chest pain.  Gastrointestinal: Negative for abdominal pain, nausea and vomiting.  Neurological: Positive for headaches. Negative for dizziness, speech difficulty, weakness and numbness.  All other systems reviewed and are negative.   Physical Exam Updated Vital Signs BP (!) 150/87 (BP Location: Right Arm)   Pulse 75   Temp 98.2 F (36.8 C) (Oral)   Resp 18   Ht 1.626 m (5\' 4" )   Wt 108.9 kg   SpO2 100%    BMI 41.20 kg/m   Physical Exam Vitals and nursing note reviewed.  Constitutional:      Appearance: She is well-developed. She is obese. She is not ill-appearing.  HENT:     Head: Normocephalic and atraumatic.  Eyes:     Extraocular Movements: Extraocular movements intact.     Pupils: Pupils are equal, round, and reactive to light.  Cardiovascular:     Rate and Rhythm: Normal rate and regular rhythm.     Heart sounds: Normal heart sounds.  Pulmonary:     Effort: Pulmonary effort is normal. No respiratory distress.     Breath sounds: No wheezing.  Abdominal:     General: Bowel sounds are normal.     Palpations: Abdomen is soft.  Musculoskeletal:     Cervical back: Normal range of motion and neck supple.  Skin:    General: Skin is warm and dry.  Neurological:     Mental Status: She is alert and oriented to person, place, and time.     Comments: Fluent speech, cranial nerves II through XII intact, 5 out of 5 strength in all 4 extremities, no dysmetria to finger-nose-finger     ED Results / Procedures / Treatments   Labs (all labs ordered are listed, but only abnormal results are displayed) Labs Reviewed - No data to display  EKG None  Radiology No results found.  Procedures Procedures (including critical care time)  Medications Ordered in ED Medications  ketorolac (TORADOL) 30 MG/ML injection 15 mg (15 mg Intramuscular Given 11/01/19 0358)    ED Course  I have reviewed the triage vital signs and the nursing notes.  Pertinent labs & imaging results that were available during my care of the patient were reviewed by me and considered in my medical decision making (see chart for details).    MDM Rules/Calculators/A&P                       Patient presents with a headache.  Ongoing for several days.  She is overall nontoxic and vital signs are reassuring.  Blood pressure running from [id number] systolic.  She is overall nontoxic-appearing and her physical exam is  benign.  No red flags.  Normal neurologic exam.  Given location and description of pain, suspect tension headache.  Patient was given IM Toradol.  4:47 AM On recheck, she reports complete resolution of headache.  Pain is reassuring.  Will discharge without further work-up.  After history, exam, and medical  workup I feel the patient has been appropriately medically screened and is safe for discharge home. Pertinent diagnoses were discussed with the patient. Patient was given return precautions.   Final Clinical Impression(s) / ED Diagnoses Final diagnoses:  Tension headache    Rx / DC Orders ED Discharge Orders    None       Auren Valdes, Barbette Hair, MD 11/01/19 [phone]

## 2019-11-01 NOTE — ED Triage Notes (Signed)
Pt reports headache and fatigue that started a couple of days ago. Pt also reports sob with exertion. Pt denies fever, denies cough, denies loss of taste or smell.

## 2019-11-15 ENCOUNTER — Ambulatory Visit: Payer: BC Managed Care – PPO | Attending: Internal Medicine

## 2019-11-15 ENCOUNTER — Ambulatory Visit: Payer: BC Managed Care – PPO

## 2019-11-15 DIAGNOSIS — Z23 Encounter for immunization: Secondary | ICD-10-CM

## 2019-11-15 NOTE — Progress Notes (Signed)
   Covid-19 Vaccination Clinic  Name:  Beverly Mccann    MRN: [id number] DOB: Jan 11, 1961  11/15/2019  [name] was observed post Covid-19 immunization for 15 minutes without incident. She was provided with Vaccine Information Sheet and instruction to access the V-Safe system.   [name] was instructed to call 911 with any severe reactions post vaccine: Marland Kitchen Difficulty breathing  . Swelling of face and throat  . A fast heartbeat  . A bad rash all over body  . Dizziness and weakness   Immunizations Administered    Name Date Dose VIS Date Route   Moderna COVID-19 Vaccine 11/15/2019 12:07 PM 0.5 mL 08/05/2019 Intramuscular   Manufacturer: Moderna   Lot: [id number]   [place][id number]

## 2019-12-17 ENCOUNTER — Ambulatory Visit: Payer: Self-pay | Attending: Internal Medicine

## 2019-12-17 DIAGNOSIS — Z23 Encounter for immunization: Secondary | ICD-10-CM

## 2019-12-17 NOTE — Progress Notes (Signed)
   Covid-19 Vaccination Clinic  Name:  Beverly Mccann    MRN: [id number] DOB: 09-22-1960  12/17/2019  Beverly Mccann was observed post Covid-19 immunization for 15 minutes without incident. She was provided with Vaccine Information Sheet and instruction to access the V-Safe system.   Beverly Mccann was instructed to call 911 with any severe reactions post vaccine: Marland Kitchen Difficulty breathing  . Swelling of face and throat  . A fast heartbeat  . A bad rash all over body  . Dizziness and weakness   Immunizations Administered    Name Date Dose VIS Date Route   Moderna COVID-19 Vaccine 12/17/2019  9:11 AM 0.5 mL 08/05/2019 Intramuscular   Manufacturer: Moderna   Lot: [id number]   [id number]

## 2020-01-24 IMAGING — MG DIGITAL SCREENING BILAT W/ TOMO W/ CAD
8 of 14 series · 8 of 40 positions shown · non-contrast
Comparison: Previous exam(s).

CLINICAL DATA: Screening.

EXAM:
DIGITAL SCREENING BILATERAL MAMMOGRAM WITH TOMO AND CAD

[L MLO synth-2D (1 of 2)]
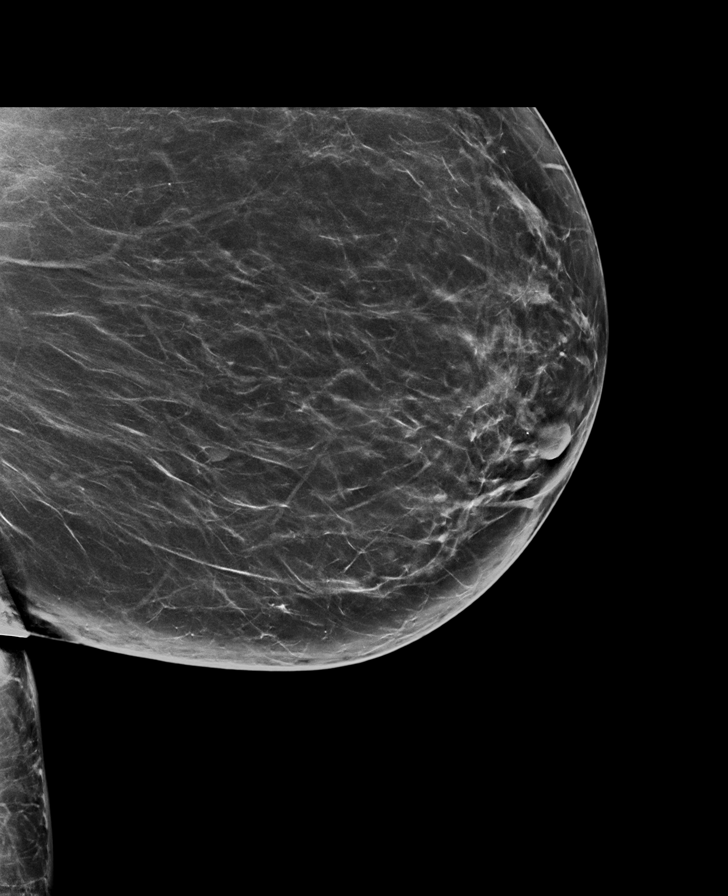

[L MLO synth-2D (2 of 2)]
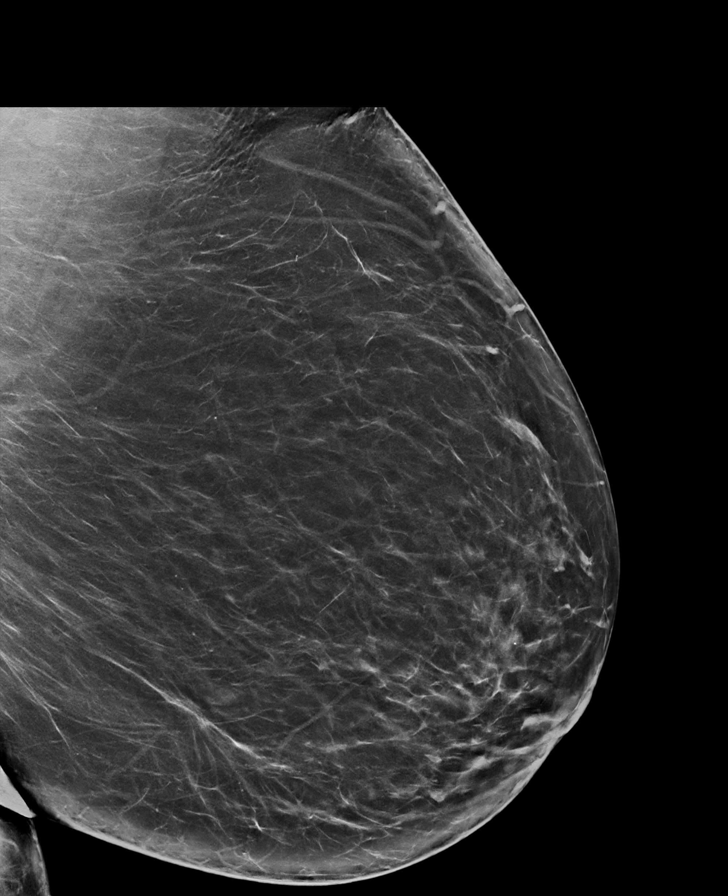

[R MLO synth-2D (1 of 2)]
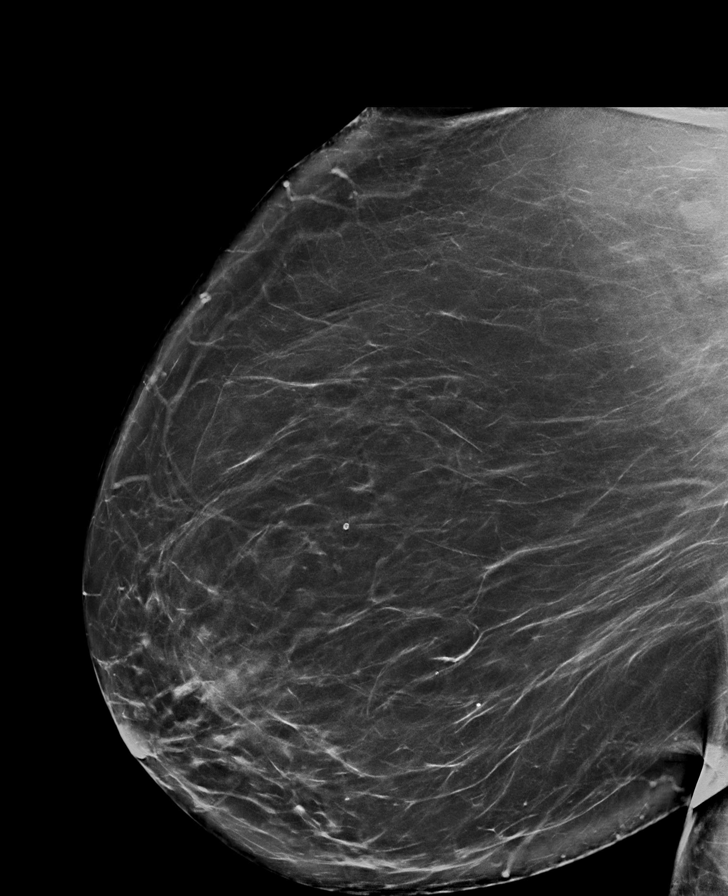

[R MLO synth-2D (2 of 2)]
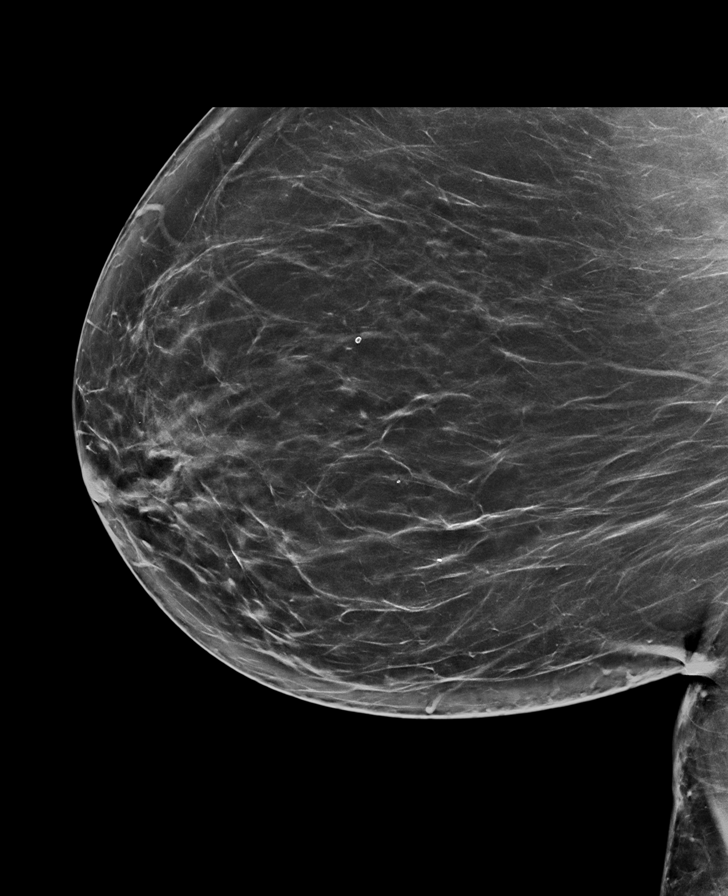

[L CC synth-2D (1 of 2)]
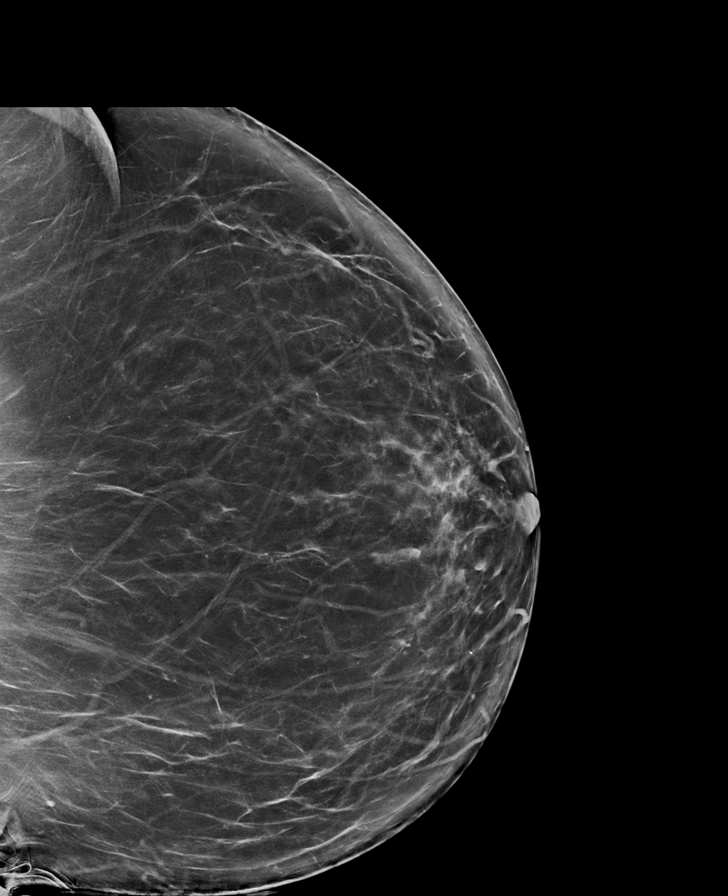

[L CC synth-2D (2 of 2)]
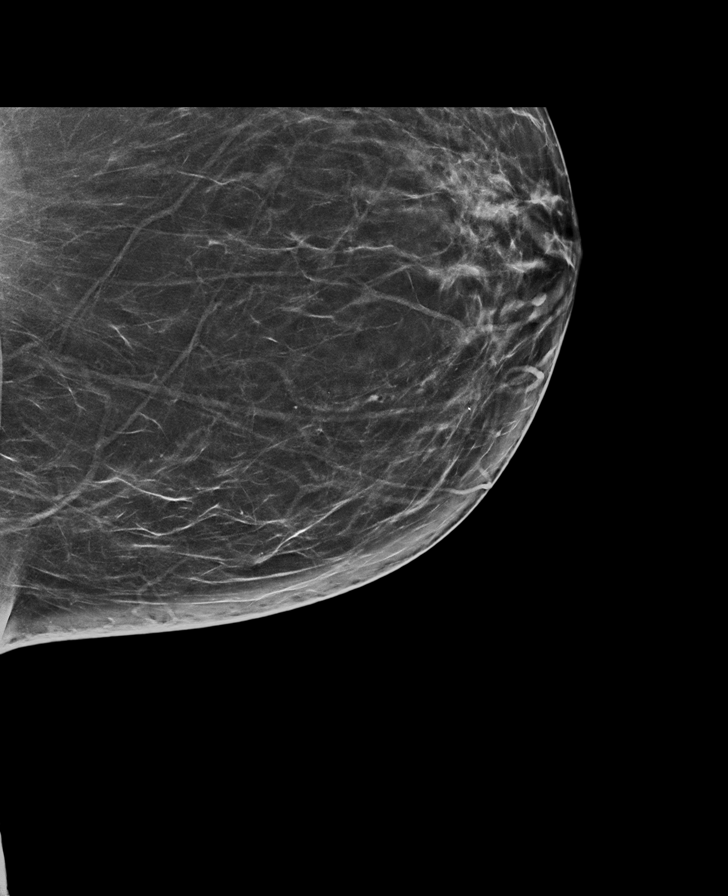

[R CC synth-2D]
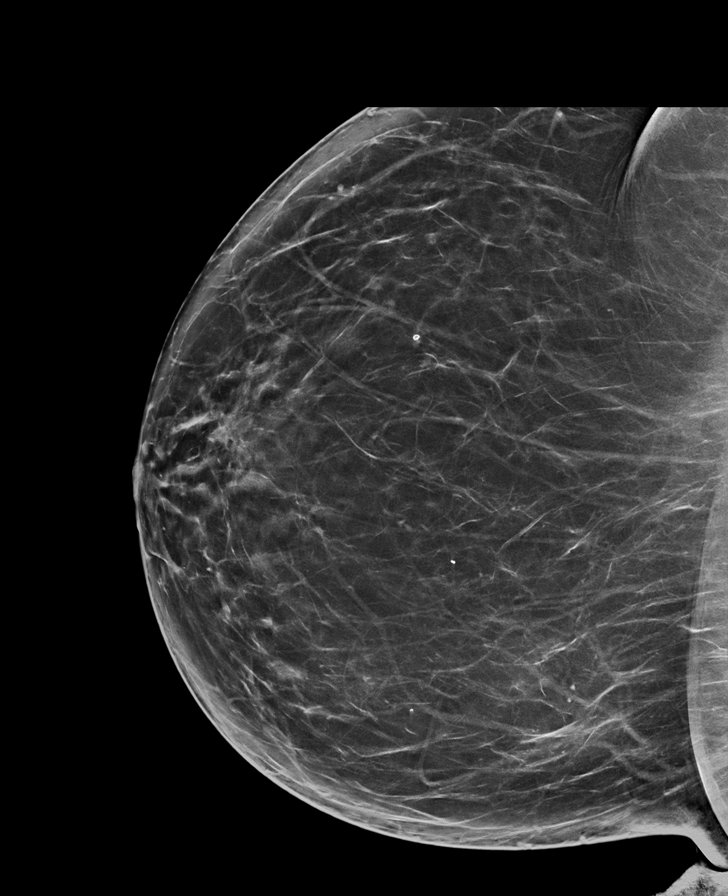

[R CC tomo · tomo slice 45/90.0]
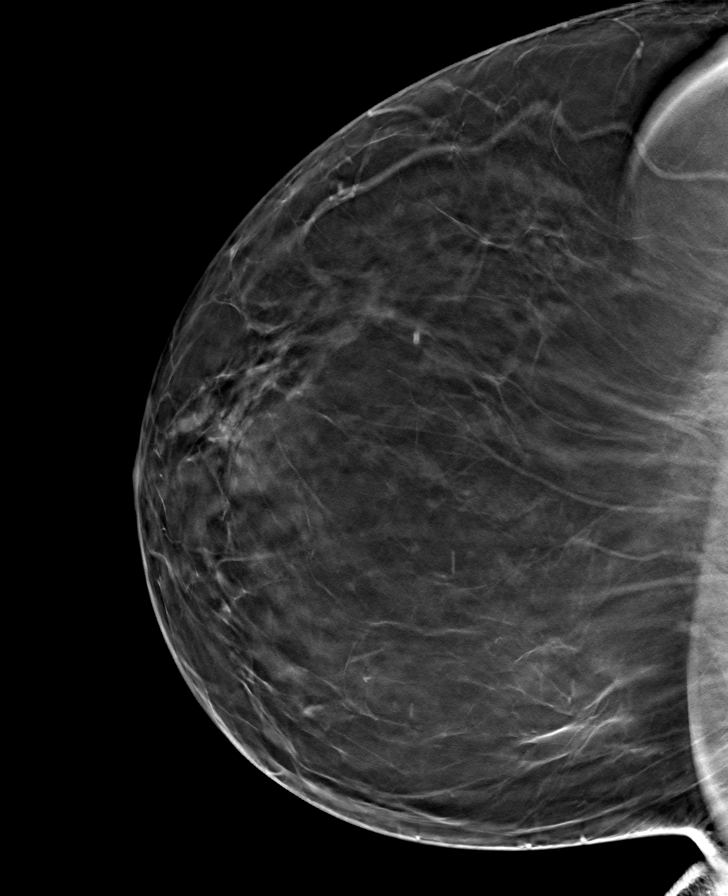

[8 of 40 positions shown; findings below may reference images not displayed]

ACR Breast Density Category b: There are scattered areas of
fibroglandular density.
FINDINGS: There are no findings suspicious for malignancy. Images were
processed with CAD.
IMPRESSION: No mammographic evidence of malignancy. A result letter of this
screening mammogram will be mailed directly to the patient.

RECOMMENDATION:
Screening mammogram in one year. (Code:CN-U-775)

BI-RADS CATEGORY  1: Negative.

## 2020-02-19 DIAGNOSIS — E1165 Type 2 diabetes mellitus with hyperglycemia: Secondary | ICD-10-CM | POA: Diagnosis not present

## 2020-02-19 DIAGNOSIS — I10 Essential (primary) hypertension: Secondary | ICD-10-CM | POA: Diagnosis not present

## 2020-02-19 DIAGNOSIS — Z1389 Encounter for screening for other disorder: Secondary | ICD-10-CM | POA: Diagnosis not present

## 2020-02-19 DIAGNOSIS — Z6841 Body Mass Index (BMI) 40.0 and over, adult: Secondary | ICD-10-CM | POA: Diagnosis not present

## 2020-02-19 DIAGNOSIS — E7849 Other hyperlipidemia: Secondary | ICD-10-CM | POA: Diagnosis not present

## 2020-04-08 ENCOUNTER — Ambulatory Visit
Admission: EM | Admit: 2020-04-08 | Discharge: 2020-04-08 | Disposition: A | Discharge status: Home / Self Care | Payer: Self-pay

## 2020-04-08 ENCOUNTER — Other Ambulatory Visit: Payer: Self-pay

## 2020-04-08 DIAGNOSIS — H66002 Acute suppurative otitis media without spontaneous rupture of ear drum, left ear: Secondary | ICD-10-CM

## 2020-04-08 MED ORDER — AMOXICILLIN-POT CLAVULANATE 875-125 MG PO TABS: 1.0000 | ORAL_TABLET | Freq: Two times a day (BID) | ORAL | 0 refills | Status: DC

## 2020-04-08 MED ORDER — FLUTICASONE PROPIONATE 50 MCG/ACT NA SUSP: 1.0000 | Freq: Every day | NASAL | 0 refills | Status: DC

## 2020-04-08 NOTE — ED Triage Notes (Signed)
Pt has swelling and pain to left side of face just below the ear, that began yesterday

## 2020-04-08 NOTE — Discharge Instructions (Addendum)
Rest and drink plenty of fluids Prescribed augmentin 875 for 7-10 days Take medications as directed and to completion Continue to use OTC ibuprofen and/ or tylenol as needed for pain control Follow up with PCP if symptoms persists Return here or go to the ER if you have any new or worsening symptoms

## 2020-04-08 NOTE — ED Provider Notes (Signed)
Beverly Mccann   [id number] 04/08/20 Arrival Time: 1206  Chief Complaint  Patient presents with   Facial Swelling     SUBJECTIVE: History from: patient.  Beverly Mccann is a 59 y.o. female who presents to the urgent care with a complaint of left facial swelling and left ear pain that started yesterday.  Denies a precipitating event, such as swimming or wearing ear plugs.  Patient states the pain is constant and achy in character.  Has not tried any OTC medication.  Denies alleviating or aggravating factors.  Denies similar symptoms in the past.  Denies fever, chills, fatigue, sinus pain, rhinorrhea, ear discharge, sore throat, SOB, wheezing, chest pain, nausea, changes in bowel or bladder habits.    ROS: As per HPI.  All other pertinent ROS negative.     Past Medical History:  Diagnosis Date   Hypercholesteremia    Hypertension    Past Surgical History:  Procedure Laterality Date   ABDOMINAL HYSTERECTOMY     COLONOSCOPY  02/12/2012   Procedure: COLONOSCOPY;  Surgeon: Danie Binder, MD;  Location: [facility];  Service: Endoscopy;  Laterality: N/A;  10:30 AM   Cyst removed from right breast     No Known Allergies No current facility-administered medications on file prior to encounter.   Current Outpatient Medications on File Prior to Encounter  Medication Sig Dispense Refill   benazepril (LOTENSIN) 40 MG tablet Take 40 mg by mouth daily.     benazepril (LOTENSIN) 40 MG tablet Take 1 tablet (40 mg total) by mouth daily. 30 tablet 1   gabapentin (NEURONTIN) 100 MG capsule Take 1 capsule (100 mg total) by mouth 3 (three) times daily. 90 capsule 1   hydrochlorothiazide (HYDRODIURIL) 25 MG tablet Take 25 mg by mouth daily.     hydrochlorothiazide (HYDRODIURIL) 25 MG tablet Take 1 tablet (25 mg total) by mouth daily. 30 tablet 1   ibuprofen (ADVIL,MOTRIN) 600 MG tablet Take 1 tablet (600 mg total) by mouth every 6 (six) hours as needed. 30 tablet 0    lisinopril-hydrochlorothiazide (ZESTORETIC) 20-25 MG tablet Take 1 tablet by mouth daily.     metFORMIN (GLUCOPHAGE) 500 MG tablet Take 1 tablet (500 mg total) by mouth 2 (two) times daily with a meal. 30 tablet 1   methocarbamol (ROBAXIN) 500 MG tablet Take 1 tablet (500 mg total) by mouth 2 (two) times daily. 20 tablet 0   Social History   Socioeconomic History   Marital status: Married    Spouse name: Not on file   Number of children: Not on file   Years of education: Not on file   Highest education level: Not on file  Occupational History   Not on file  Tobacco Use   Smoking status: Former Smoker    Packs/day: 0.50    Years: 15.00    Pack years: 7.50    Types: Cigarettes    Quit date: 11/12/2011    Years since quitting: 8.4   Smokeless tobacco: Never Used  Vaping Use   Vaping Use: Never used  Substance and Sexual Activity   Alcohol use: Yes    Comment: occasionally   Drug use: No   Sexual activity: Yes    Birth control/protection: Surgical  Other Topics Concern   Not on file  Social History Narrative   Not on file   Social Determinants of Health   Financial Resource Strain:    Difficulty of Paying Living Expenses:   Food Insecurity:  Worried About Charity fundraiser in the Last Year:    Arboriculturist in the Last Year:   Transportation Needs:    Film/video editor (Medical):    Lack of Transportation (Non-Medical):   Physical Activity:    Days of Exercise per Week:    Minutes of Exercise per Session:   Stress:    Feeling of Stress :   Social Connections:    Frequency of Communication with Friends and Family:    Frequency of Social Gatherings with Friends and Family:    Attends Religious Services:    Active Member of Clubs or Organizations:    Attends Music therapist:    Marital Status:   Intimate Partner Violence:    Fear of Current or Ex-Partner:    Emotionally Abused:    Physically Abused:     Sexually Abused:    Family History  Problem Relation Age of Onset   Hypertension Mother    Diabetes Mother    Hypertension Father     OBJECTIVE:  Vitals:   04/08/20 1225  BP: (!) 149/82  Pulse: 82  Resp: 20  Temp: 98.3 F (36.8 C)  SpO2: 96%     Physical Exam Vitals and nursing note reviewed.  Constitutional:      General: She is not in acute distress.    Appearance: Normal appearance. She is normal weight. She is not ill-appearing, toxic-appearing or diaphoretic.  HENT:     Head: Normocephalic.     Right Ear: Hearing, tympanic membrane, ear canal and external ear normal. There is no impacted cerumen.     Left Ear: Hearing, ear canal and external ear normal. There is no impacted cerumen. Tympanic membrane is erythematous and bulging.  Cardiovascular:     Rate and Rhythm: Normal rate and regular rhythm.     Pulses: Normal pulses.     Heart sounds: Normal heart sounds. No murmur heard.  No friction rub. No gallop.   Pulmonary:     Effort: Pulmonary effort is normal. No respiratory distress.     Breath sounds: Normal breath sounds. No stridor. No wheezing, rhonchi or rales.  Chest:     Chest wall: No tenderness.  Neurological:     Mental Status: She is alert.     Imaging: No results found.   ASSESSMENT & PLAN:  1. Non-recurrent acute suppurative otitis media of left ear without spontaneous rupture of tympanic membrane     Meds ordered this encounter  Medications   amoxicillin-clavulanate (AUGMENTIN) 875-125 MG tablet    Sig: Take 1 tablet by mouth every 12 (twelve) hours.    Dispense:  14 tablet    Refill:  0   Discharge Instructions Rest and drink plenty of fluids Prescribed augmentin 875 for 7-10 days Take medications as directed and to completion Continue to use OTC ibuprofen and/ or tylenol as needed for pain control Follow up with PCP if symptoms persists Return here or go to the ER if you have any new or worsening symptoms   Reviewed  expectations re: course of current medical issues. Questions answered. Outlined signs and symptoms indicating need for more acute intervention. Patient verbalized understanding. After Visit Summary given.         Beverly Mccann 04/08/20 1326

## 2020-12-31 ENCOUNTER — Encounter: Payer: Self-pay | Admitting: Emergency Medicine

## 2020-12-31 ENCOUNTER — Ambulatory Visit
Admission: EM | Admit: 2020-12-31 | Discharge: 2020-12-31 | Disposition: A | Discharge status: Home / Self Care | Payer: BC Managed Care – PPO | Attending: Emergency Medicine | Admitting: Emergency Medicine

## 2020-12-31 ENCOUNTER — Other Ambulatory Visit: Payer: Self-pay

## 2020-12-31 DIAGNOSIS — G8929 Other chronic pain: Secondary | ICD-10-CM | POA: Diagnosis not present

## 2020-12-31 DIAGNOSIS — M25562 Pain in left knee: Secondary | ICD-10-CM

## 2020-12-31 MED ORDER — PREDNISONE 20 MG PO TABS: 20.0000 mg | ORAL_TABLET | Freq: Two times a day (BID) | ORAL | 0 refills | Status: AC

## 2020-12-31 NOTE — ED Triage Notes (Signed)
LT knee pain that has been chronic x 1 year.  Pt has been working and standing up a lot and is having increased to that knee.

## 2020-12-31 NOTE — ED Provider Notes (Signed)
[place]   [id number] 12/31/20 Arrival Time: 6606  CC: LT Knee PAIN  SUBJECTIVE: History from: patient. Beverly Mccann is a 60 y.o. female complains of acute on chronic LT knee pain x 1 year, worsening symptoms over the past few days.  Denies a precipitating event or specific injury.  Localizes the pain to the inside of knee.  Describes the pain as intermittent and throbbing in character.  Has tried OTC medications without relief.  Symptoms are made worse with standing from a seated position.  Denies similar symptoms in the past.  Denies fever, chills, erythema, ecchymosis, effusion, weakness, numbness and tingling.  ROS: As per HPI.  All other pertinent ROS negative.     Past Medical History:  Diagnosis Date  . Hypercholesteremia   . Hypertension    Past Surgical History:  Procedure Laterality Date  . ABDOMINAL HYSTERECTOMY    . COLONOSCOPY  02/12/2012   Procedure: COLONOSCOPY;  Surgeon: Danie Binder, MD;  Location: [facility];  Service: Endoscopy;  Laterality: N/A;  10:30 AM  . Cyst removed from right breast     No Known Allergies No current facility-administered medications on file prior to encounter.   Current Outpatient Medications on File Prior to Encounter  Medication Sig Dispense Refill  . benazepril (LOTENSIN) 40 MG tablet Take 40 mg by mouth daily.    . benazepril (LOTENSIN) 40 MG tablet Take 1 tablet (40 mg total) by mouth daily. 30 tablet 1  . fluticasone (FLONASE) 50 MCG/ACT nasal spray Place 1 spray into both nostrils daily for 14 days. 16 g 0  . gabapentin (NEURONTIN) 100 MG capsule Take 1 capsule (100 mg total) by mouth 3 (three) times daily. 90 capsule 1  . hydrochlorothiazide (HYDRODIURIL) 25 MG tablet Take 25 mg by mouth daily.    . hydrochlorothiazide (HYDRODIURIL) 25 MG tablet Take 1 tablet (25 mg total) by mouth daily. 30 tablet 1  . ibuprofen (ADVIL,MOTRIN) 600 MG tablet Take 1 tablet (600 mg total) by mouth every 6 (six) hours as needed. 30  tablet 0  . lisinopril-hydrochlorothiazide (ZESTORETIC) 20-25 MG tablet Take 1 tablet by mouth daily.    . metFORMIN (GLUCOPHAGE) 500 MG tablet Take 1 tablet (500 mg total) by mouth 2 (two) times daily with a meal. 30 tablet 1  . methocarbamol (ROBAXIN) 500 MG tablet Take 1 tablet (500 mg total) by mouth 2 (two) times daily. 20 tablet 0   Social History   Socioeconomic History  . Marital status: Married    Spouse name: Not on file  . Number of children: Not on file  . Years of education: Not on file  . Highest education level: Not on file  Occupational History  . Not on file  Tobacco Use  . Smoking status: Former Smoker    Packs/day: 0.50    Years: 15.00    Pack years: 7.50    Types: Cigarettes    Quit date: 11/12/2011    Years since quitting: 9.1  . Smokeless tobacco: Never Used  Vaping Use  . Vaping Use: Never used  Substance and Sexual Activity  . Alcohol use: Yes    Comment: occasionally  . Drug use: No  . Sexual activity: Yes    Birth control/protection: Surgical  Other Topics Concern  . Not on file  Social History Narrative  . Not on file   Social Determinants of Health   Financial Resource Strain: Not on file  Food Insecurity: Not on file  Transportation  Needs: Not on file  Physical Activity: Not on file  Stress: Not on file  Social Connections: Not on file  Intimate Partner Violence: Not on file   Family History  Problem Relation Age of Onset  . Hypertension Mother   . Diabetes Mother   . Hypertension Father     OBJECTIVE:  Vitals:   12/31/20 1315  BP: (!) 184/105  Pulse: 83  Resp: 18  Temp: 98.7 F (37.1 C)  TempSrc: Oral  SpO2: 98%    General appearance: ALERT; in no acute distress.  Head: NCAT Lungs: Normal respiratory effort Musculoskeletal: LT knee Inspection: Skin warm, dry, clear and intact without obvious erythema, effusion, or ecchymosis.  Palpation: Nontender to palpation ROM: FROM active and passive Strength: 5/5 knee flexion,  5/5 knee extension Skin: warm and dry Neurologic: Ambulates without difficulty Psychological: alert and cooperative; normal mood and affect  ASSESSMENT & PLAN:  1. Chronic pain of left knee     Meds ordered this encounter  Medications  . predniSONE (DELTASONE) 20 MG tablet    Sig: Take 1 tablet (20 mg total) by mouth 2 (two) times daily with a meal for 5 days.    Dispense:  10 tablet    Refill:  0    Order Specific Question:   Supervising Provider    Answer:   Raylene Everts [[id number]]    Continue conservative management of rest, ice, and elevation Prednisone prescribed Alternate ibuprofen and tylenol Follow up with orthopedist for further evaluation and management Return or go to the ER if you have any new or worsening symptoms (fever, chills, chest pain, redness, swelling, bruising, deformity, etc...)     Reviewed expectations re: course of current medical issues. Questions answered. Outlined signs and symptoms indicating need for more acute intervention. Patient verbalized understanding. After Visit Summary given.    Lestine Box, PA-C 12/31/20 1345

## 2020-12-31 NOTE — Discharge Instructions (Signed)
Continue conservative management of rest, ice, and elevation Prednisone prescribed Alternate ibuprofen and tylenol Follow up with orthopedist for further evaluation and management Return or go to the ER if you have any new or worsening symptoms (fever, chills, chest pain, redness, swelling, bruising, deformity, etc...)

## 2021-01-03 ENCOUNTER — Other Ambulatory Visit: Payer: Self-pay

## 2021-01-03 ENCOUNTER — Ambulatory Visit
Admission: EM | Admit: 2021-01-03 | Discharge: 2021-01-03 | Disposition: A | Discharge status: Home / Self Care | Payer: BC Managed Care – PPO | Attending: Family Medicine | Admitting: Family Medicine

## 2021-01-03 DIAGNOSIS — Z20822 Contact with and (suspected) exposure to covid-19: Secondary | ICD-10-CM

## 2021-01-03 NOTE — ED Triage Notes (Signed)
Pt needs covid test for travel.

## 2021-01-04 LAB — NOVEL CORONAVIRUS, NAA: SARS-CoV-2, NAA: NOT DETECTED

## 2021-05-17 ENCOUNTER — Other Ambulatory Visit: Payer: Self-pay

## 2021-05-17 ENCOUNTER — Ambulatory Visit
Admission: EM | Admit: 2021-05-17 | Discharge: 2021-05-17 | Disposition: A | Discharge status: Home / Self Care | Payer: BC Managed Care – PPO | Attending: Internal Medicine | Admitting: Internal Medicine

## 2021-05-17 ENCOUNTER — Encounter: Payer: Self-pay | Admitting: *Deleted

## 2021-05-17 DIAGNOSIS — Z760 Encounter for issue of repeat prescription: Secondary | ICD-10-CM | POA: Diagnosis not present

## 2021-05-17 MED ORDER — LISINOPRIL-HYDROCHLOROTHIAZIDE 20-25 MG PO TABS: 1.0000 | ORAL_TABLET | Freq: Every day | ORAL | 1 refills | Status: DC

## 2021-05-17 MED ORDER — GABAPENTIN 100 MG PO CAPS: 100.0000 mg | ORAL_CAPSULE | Freq: Three times a day (TID) | ORAL | 1 refills | Status: DC

## 2021-05-17 MED ORDER — METFORMIN HCL 500 MG PO TABS: 500.0000 mg | ORAL_TABLET | Freq: Two times a day (BID) | ORAL | 1 refills | Status: DC

## 2021-05-17 NOTE — ED Provider Notes (Signed)
[facility]    CSN: [id number] Arrival date & time: 05/17/21  [id number]      History   Chief Complaint Chief Complaint  Patient presents with   Hypertension   Medication Refill    HPI Beverly Mccann is a 60 y.o. female with a history of hypertension comes to the urgent care with requesting blood pressure medication refill.  Patient is on lisinopril-hydrochlorothiazide.  She ran out a few days ago.  She denies any headaches, chest pain or abdominal pain.  No chest tightness.  No shortness of breath.  No difficulty with her speech.  No numbness or tingling.   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   HYPERLIPIDEMIA 08/18/2006   OBESITY NOS 08/18/2006   HYPERTENSION 08/18/2006   ECZEMA 08/18/2006   OSTEOARTHRITIS 08/18/2006   HYPERGLYCEMIA 08/18/2006    Past Surgical History:  Procedure Laterality Date   ABDOMINAL HYSTERECTOMY     COLONOSCOPY  02/12/2012   Procedure: COLONOSCOPY;  Surgeon: Danie Binder, MD;  Location: [facility];  Service: Endoscopy;  Laterality: N/A;  10:30 AM   Cyst removed from right breast      OB History     Gravida  3   Para  3   Term  3   Preterm      AB      Living  3      SAB      IAB      Ectopic      Multiple      Live Births               Home Medications    Prior to Admission medications   Medication Sig Start Date End Date Taking? Authorizing Provider  fluticasone (FLONASE) 50 MCG/ACT nasal spray Place 1 spray into both nostrils daily for 14 days. 04/08/20 04/22/20  Avegno, Darrelyn Hillock, FNP  gabapentin (NEURONTIN) 100 MG capsule Take 1 capsule (100 mg total) by mouth 3 (three) times daily. 05/17/21   Chase Picket, MD  lisinopril-hydrochlorothiazide (ZESTORETIC) 20-25 MG tablet Take 1 tablet by mouth daily. 05/17/21    Chase Picket, MD  metFORMIN (GLUCOPHAGE) 500 MG tablet Take 1 tablet (500 mg total) by mouth 2 (two) times daily with a meal. 05/17/21 06/16/21  Romana Deaton, Myrene Galas, MD  methocarbamol (ROBAXIN) 500 MG tablet Take 1 tablet (500 mg total) by mouth 2 (two) times daily. 08/05/16   Shary Decamp, PA-C    Family History Family History  Problem Relation Age of Onset   Hypertension Mother    Diabetes Mother    Hypertension Father     Social History Social History   Tobacco Use   Smoking status: Former    Packs/day: 0.50    Years: 15.00    Pack years: 7.50    Types: Cigarettes    Quit date: 11/12/2011    Years since quitting: 9.5   Smokeless tobacco: Never  Vaping Use   Vaping Use: Never used  Substance Use Topics   Alcohol use: Yes    Comment: occasionally   Drug use: No     Allergies   Patient has no known allergies.   Review of Systems Review of Systems  Constitutional: Negative.   Respiratory: Negative.    Cardiovascular: Negative.  Genitourinary: Negative.   Neurological: Negative.     Physical Exam Triage Vital Signs ED Triage Vitals [05/17/21 1002]  Enc Vitals Group     BP (!) 178/82     Pulse Rate 83     Resp 18     Temp 97.9 F (36.6 C)     Temp src      SpO2 97 %     Weight      Height      Head Circumference      Peak Flow      Pain Score      Pain Loc      Pain Edu?      Excl. in [place]?    No data found.  Updated Vital Signs BP (!) 178/82   Pulse 83   Temp 97.9 F (36.6 C)   Resp 18   SpO2 97%   Visual Acuity Right Eye Distance:   Left Eye Distance:   Bilateral Distance:    Right Eye Near:   Left Eye Near:    Bilateral Near:     Physical Exam Vitals and nursing note reviewed.  Constitutional:      General: She is not in acute distress.    Appearance: She is not ill-appearing.  Eyes:     Extraocular Movements: Extraocular movements intact.     Pupils: Pupils are equal, round, and reactive to light.  Cardiovascular:     Rate  and Rhythm: Normal rate and regular rhythm.     Pulses: Normal pulses.     Heart sounds: Normal heart sounds.  Pulmonary:     Effort: Pulmonary effort is normal.     Breath sounds: Normal breath sounds.  Abdominal:     General: Bowel sounds are normal.     Palpations: Abdomen is soft.  Musculoskeletal:        General: Normal range of motion.  Neurological:     Mental Status: She is alert.     UC Treatments / Results  Labs (all labs ordered are listed, but only abnormal results are displayed) Labs Reviewed - No data to display  EKG   Radiology No results found.  Procedures Procedures (including critical care time)  Medications Ordered in UC Medications - No data to display  Initial Impression / Assessment and Plan / UC Course  I have reviewed the triage vital signs and the nursing notes.  Pertinent labs & imaging results that were available during my care of the patient were reviewed by me and considered in my medical decision making (see chart for details).     1.  Medication refill: Antihypertensive-lisinopril-hydrochlorothiazide-has been sent to the pharmacy Metformin sent to the pharmacy Follow-up with primary care physician recommended. Final Clinical Impressions(s) / UC Diagnoses   Final diagnoses:  Encounter for medication refill     Discharge Instructions      Please pick up your blood pressure medications at the pharmacy on record. If you have any symptoms please return to urgent care to be reevaluated.   ED Prescriptions     Medication Sig Dispense Auth. Provider   gabapentin (NEURONTIN) 100 MG capsule Take 1 capsule (100 mg total) by mouth 3 (three) times daily. 90 capsule Jakoby Melendrez, Myrene Galas, MD   lisinopril-hydrochlorothiazide (ZESTORETIC) 20-25 MG tablet Take 1 tablet by mouth daily. 30 tablet Trevaun Rendleman, Myrene Galas, MD   metFORMIN (GLUCOPHAGE) 500 MG tablet Take 1 tablet (500 mg total) by mouth 2 (two) times daily with a meal. 60 tablet  Caydee Talkington,  Myrene Galas, MD      PDMP not reviewed this encounter.   Chase Picket, MD 05/17/21 1054

## 2021-05-17 NOTE — Discharge Instructions (Signed)
Please pick up your blood pressure medications at the pharmacy on record. If you have any symptoms please return to urgent care to be reevaluated.

## 2021-05-17 NOTE — ED Triage Notes (Signed)
Pt reports needing a refill on BP meds. Pt has an appt at end of month with PCP.

## 2021-06-01 DIAGNOSIS — Z0001 Encounter for general adult medical examination with abnormal findings: Secondary | ICD-10-CM | POA: Diagnosis not present

## 2021-06-01 DIAGNOSIS — E119 Type 2 diabetes mellitus without complications: Secondary | ICD-10-CM | POA: Diagnosis not present

## 2021-06-01 DIAGNOSIS — Z13220 Encounter for screening for lipoid disorders: Secondary | ICD-10-CM | POA: Diagnosis not present

## 2021-06-01 DIAGNOSIS — Z1331 Encounter for screening for depression: Secondary | ICD-10-CM | POA: Diagnosis not present

## 2021-06-01 DIAGNOSIS — I10 Essential (primary) hypertension: Secondary | ICD-10-CM | POA: Diagnosis not present

## 2021-06-01 DIAGNOSIS — M159 Polyosteoarthritis, unspecified: Secondary | ICD-10-CM | POA: Diagnosis not present

## 2021-06-01 DIAGNOSIS — E669 Obesity, unspecified: Secondary | ICD-10-CM | POA: Diagnosis not present

## 2021-07-06 DIAGNOSIS — Z1231 Encounter for screening mammogram for malignant neoplasm of breast: Secondary | ICD-10-CM | POA: Diagnosis not present

## 2021-07-08 DIAGNOSIS — M170 Bilateral primary osteoarthritis of knee: Secondary | ICD-10-CM | POA: Diagnosis not present

## 2021-07-08 DIAGNOSIS — Z6841 Body Mass Index (BMI) 40.0 and over, adult: Secondary | ICD-10-CM | POA: Diagnosis not present

## 2021-07-24 DIAGNOSIS — T7840XA Allergy, unspecified, initial encounter: Secondary | ICD-10-CM | POA: Diagnosis not present

## 2021-07-24 DIAGNOSIS — Z79899 Other long term (current) drug therapy: Secondary | ICD-10-CM | POA: Diagnosis not present

## 2021-07-24 DIAGNOSIS — I10 Essential (primary) hypertension: Secondary | ICD-10-CM | POA: Diagnosis not present

## 2021-07-24 DIAGNOSIS — R21 Rash and other nonspecific skin eruption: Secondary | ICD-10-CM | POA: Diagnosis not present

## 2021-07-24 DIAGNOSIS — Z7984 Long term (current) use of oral hypoglycemic drugs: Secondary | ICD-10-CM | POA: Diagnosis not present

## 2021-07-24 DIAGNOSIS — Z90710 Acquired absence of both cervix and uterus: Secondary | ICD-10-CM | POA: Diagnosis not present

## 2021-08-03 DIAGNOSIS — G8929 Other chronic pain: Secondary | ICD-10-CM | POA: Diagnosis not present

## 2021-08-03 DIAGNOSIS — M25561 Pain in right knee: Secondary | ICD-10-CM | POA: Diagnosis not present

## 2021-08-03 DIAGNOSIS — M25562 Pain in left knee: Secondary | ICD-10-CM | POA: Diagnosis not present

## 2021-08-03 DIAGNOSIS — M170 Bilateral primary osteoarthritis of knee: Secondary | ICD-10-CM | POA: Diagnosis not present

## 2021-08-03 DIAGNOSIS — R262 Difficulty in walking, not elsewhere classified: Secondary | ICD-10-CM | POA: Diagnosis not present

## 2021-08-05 DIAGNOSIS — G8929 Other chronic pain: Secondary | ICD-10-CM | POA: Diagnosis not present

## 2021-08-05 DIAGNOSIS — M25562 Pain in left knee: Secondary | ICD-10-CM | POA: Diagnosis not present

## 2021-08-05 DIAGNOSIS — R262 Difficulty in walking, not elsewhere classified: Secondary | ICD-10-CM | POA: Diagnosis not present

## 2021-08-05 DIAGNOSIS — M170 Bilateral primary osteoarthritis of knee: Secondary | ICD-10-CM | POA: Diagnosis not present

## 2021-08-05 DIAGNOSIS — M25561 Pain in right knee: Secondary | ICD-10-CM | POA: Diagnosis not present

## 2021-08-09 DIAGNOSIS — R262 Difficulty in walking, not elsewhere classified: Secondary | ICD-10-CM | POA: Diagnosis not present

## 2021-08-09 DIAGNOSIS — M170 Bilateral primary osteoarthritis of knee: Secondary | ICD-10-CM | POA: Diagnosis not present

## 2021-08-09 DIAGNOSIS — M25562 Pain in left knee: Secondary | ICD-10-CM | POA: Diagnosis not present

## 2021-08-09 DIAGNOSIS — M25561 Pain in right knee: Secondary | ICD-10-CM | POA: Diagnosis not present

## 2021-08-09 DIAGNOSIS — G8929 Other chronic pain: Secondary | ICD-10-CM | POA: Diagnosis not present

## 2021-08-10 ENCOUNTER — Emergency Department (HOSPITAL_COMMUNITY)
Admission: EM | Admit: 2021-08-10 | Discharge: 2021-08-10 | Disposition: A | Discharge status: Home / Self Care | Payer: BC Managed Care – PPO | Attending: Emergency Medicine | Admitting: Emergency Medicine

## 2021-08-10 ENCOUNTER — Encounter (HOSPITAL_COMMUNITY): Payer: Self-pay | Admitting: Emergency Medicine

## 2021-08-10 ENCOUNTER — Other Ambulatory Visit: Payer: Self-pay

## 2021-08-10 DIAGNOSIS — Z87891 Personal history of nicotine dependence: Secondary | ICD-10-CM | POA: Insufficient documentation

## 2021-08-10 DIAGNOSIS — Z79899 Other long term (current) drug therapy: Secondary | ICD-10-CM | POA: Insufficient documentation

## 2021-08-10 DIAGNOSIS — R21 Rash and other nonspecific skin eruption: Secondary | ICD-10-CM | POA: Diagnosis not present

## 2021-08-10 DIAGNOSIS — I10 Essential (primary) hypertension: Secondary | ICD-10-CM | POA: Insufficient documentation

## 2021-08-10 DIAGNOSIS — L309 Dermatitis, unspecified: Secondary | ICD-10-CM | POA: Diagnosis not present

## 2021-08-10 LAB — CBC WITH DIFFERENTIAL/PLATELET
Abs Immature Granulocytes: 0.01 10*3/uL (ref 0.00–0.07)
Basophils Absolute: 0 10*3/uL (ref 0.0–0.1)
Basophils Relative: 0 %
Eosinophils Absolute: 0.1 10*3/uL (ref 0.0–0.5)
Eosinophils Relative: 1 %
HCT: 45 % (ref 36.0–46.0)
Hemoglobin: 14.2 g/dL (ref 12.0–15.0)
Immature Granulocytes: 0 %
Lymphocytes Relative: 60 %
Lymphs Abs: 5.2 10*3/uL — ABNORMAL HIGH (ref 0.7–4.0)
MCH: 26.9 pg (ref 26.0–34.0)
MCHC: 31.6 g/dL (ref 30.0–36.0)
MCV: 85.4 fL (ref 80.0–100.0)
Monocytes Absolute: 0.3 10*3/uL (ref 0.1–1.0)
Monocytes Relative: 4 %
Neutro Abs: 3 10*3/uL (ref 1.7–7.7)
Neutrophils Relative %: 35 %
Platelets: 226 10*3/uL (ref 150–400)
RBC: 5.27 MIL/uL — ABNORMAL HIGH (ref 3.87–5.11)
RDW: 15 % (ref 11.5–15.5)
WBC: 8.7 10*3/uL (ref 4.0–10.5)
nRBC: 0 % (ref 0.0–0.2)

## 2021-08-10 LAB — COMPREHENSIVE METABOLIC PANEL
ALT: 38 U/L (ref 0–44)
AST: 27 U/L (ref 15–41)
Albumin: 4.2 g/dL (ref 3.5–5.0)
Alkaline Phosphatase: 73 U/L (ref 38–126)
Anion gap: 12 (ref 5–15)
BUN: 16 mg/dL (ref 8–23)
CO2: 25 mmol/L (ref 22–32)
Calcium: 9.4 mg/dL (ref 8.9–10.3)
Chloride: 104 mmol/L (ref 98–111)
Creatinine, Ser: 0.86 mg/dL (ref 0.44–1.00)
GFR, Estimated: >60 mL/min (ref >60)
Glucose, Bld: 192 mg/dL — ABNORMAL HIGH (ref 70–99)
Potassium: 3.2 mmol/L — ABNORMAL LOW (ref 3.5–5.1)
Sodium: 141 mmol/L (ref 135–145)
Total Bilirubin: 0.6 mg/dL (ref 0.3–1.2)
Total Protein: 7.8 g/dL (ref 6.5–8.1)

## 2021-08-10 MED ORDER — HYDROXYZINE HCL 25 MG PO TABS
25.0000 mg | ORAL_TABLET | Freq: Once | ORAL | Status: AC
  Administered 2021-08-10: 25 mg via ORAL
  Filled 2021-08-10: qty 1

## 2021-08-10 MED ORDER — TRIAMCINOLONE ACETONIDE 0.1 % EX CREA: TOPICAL_CREAM | Freq: Three times a day (TID) | CUTANEOUS | 1 refills | Status: AC

## 2021-08-10 MED ORDER — TRIAMCINOLONE ACETONIDE 0.1 % EX CREA
TOPICAL_CREAM | Freq: Once | CUTANEOUS | Status: AC
  Administered 2021-08-10: 1 via TOPICAL
  Filled 2021-08-10: qty 15

## 2021-08-10 MED ORDER — HYDROXYZINE HCL 25 MG PO TABS: 25.0000 mg | ORAL_TABLET | Freq: Three times a day (TID) | ORAL | 0 refills | Status: DC | PRN

## 2021-08-10 NOTE — ED Provider Notes (Signed)
[facility] EMERGENCY DEPARTMENT Provider Note   CSN: [id number] Arrival date & time: 08/10/21  7616     History Chief Complaint  Patient presents with   Pruritis    Beverly Mccann is a 60 y.o. female.  60 year old female who presents to the emergency department today with itching.  Patient has a history of eczema.  She states that she had breaking out in rash underneath knee sleeve that she was wearing but she also had up around her short line and also on her left arm.  She states that she saw someone a week ago and they gave her oral prednisone along with Benadryl and it did not seem to improve it much.  She had some cramps in the past that seem to help but she not sure what those were called.  She has no shortness of breath, nausea, vomiting or other evidence of anaphylaxis.  No other exposures that she knows of.       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   HYPERLIPIDEMIA 08/18/2006   OBESITY NOS 08/18/2006   HYPERTENSION 08/18/2006   ECZEMA 08/18/2006   OSTEOARTHRITIS 08/18/2006   HYPERGLYCEMIA 08/18/2006    Past Surgical History:  Procedure Laterality Date   ABDOMINAL HYSTERECTOMY     COLONOSCOPY  02/12/2012   Procedure: COLONOSCOPY;  Surgeon: Danie Binder, MD;  Location: [facility];  Service: Endoscopy;  Laterality: N/A;  10:30 AM   Cyst removed from right breast       OB History     Gravida  3   Para  3   Term  3   Preterm      AB      Living  3      SAB      IAB      Ectopic      Multiple      Live Births              Family History  Problem Relation Age of Onset   Hypertension Mother    Diabetes Mother    Hypertension Father     Social History   Tobacco Use   Smoking status: Former    Packs/day: 0.50    Years: 15.00    Pack years: 7.50     Types: Cigarettes    Quit date: 11/12/2011    Years since quitting: 9.7   Smokeless tobacco: Never  Vaping Use   Vaping Use: Never used  Substance Use Topics   Alcohol use: Yes    Comment: occasionally   Drug use: No    Home Medications Prior to Admission medications   Medication Sig Start Date End Date Taking? Authorizing Provider  hydrOXYzine (ATARAX) 25 MG tablet Take 1 tablet (25 mg total) by mouth every 8 (eight) hours as needed for itching. 08/10/21  Yes Nishika Parkhurst, Corene Cornea, MD  triamcinolone 0.1%-Eucerin equivalent 1:1 cream mixture Apply topically 3 (three) times daily for 10 days. 08/10/21 08/20/21 Yes Emmanual Gauthreaux, Corene Cornea, MD  fluticasone (FLONASE) 50 MCG/ACT nasal spray Place 1 spray into both nostrils daily for 14 days. 04/08/20 04/22/20  Avegno, Darrelyn Hillock, FNP  gabapentin (NEURONTIN) 100 MG capsule Take 1 capsule (100 mg total) by mouth 3 (three) times daily. 05/17/21   Chase Picket, MD  lisinopril-hydrochlorothiazide (ZESTORETIC) 20-25  MG tablet Take 1 tablet by mouth daily. 05/17/21   Chase Picket, MD  metFORMIN (GLUCOPHAGE) 500 MG tablet Take 1 tablet (500 mg total) by mouth 2 (two) times daily with a meal. 05/17/21 06/16/21  Lamptey, Myrene Galas, MD  methocarbamol (ROBAXIN) 500 MG tablet Take 1 tablet (500 mg total) by mouth 2 (two) times daily. 08/05/16   Shary Decamp, PA-C    Allergies    Patient has no known allergies.  Review of Systems   Review of Systems  All other systems reviewed and are negative.  Physical Exam Updated Vital Signs BP 125/87 (BP Location: Right Arm)   Pulse (!) 126   Temp 98.3 F (36.8 C) (Oral)   Resp 18   Ht 5\' 2"  (1.575 m)   Wt 111.1 kg   SpO2 98%   BMI 44.81 kg/m   Physical Exam Vitals and nursing note reviewed.  Constitutional:      Appearance: She is well-developed.  HENT:     Head: Normocephalic and atraumatic.     Nose: Nose normal. No congestion or rhinorrhea.     Mouth/Throat:     Mouth: Mucous membranes are moist.     Pharynx:  Oropharynx is clear.  Eyes:     Pupils: Pupils are equal, round, and reactive to light.  Cardiovascular:     Rate and Rhythm: Normal rate and regular rhythm.  Pulmonary:     Effort: Pulmonary effort is normal. No respiratory distress.     Breath sounds: No stridor.  Abdominal:     General: Abdomen is flat. There is no distension.  Musculoskeletal:        General: No swelling or tenderness. Normal range of motion.     Cervical back: Normal range of motion.  Skin:    General: Skin is warm and dry.     Comments: Eczematous rash around her right knee, upper shirt neck line and Left elbow.  Excoriations present.  Neurological:     General: No focal deficit present.     Mental Status: She is alert.    ED Results / Procedures / Treatments   Labs (all labs ordered are listed, but only abnormal results are displayed) Labs Reviewed  CBC WITH DIFFERENTIAL/PLATELET - Abnormal; Notable for the following components:      Result Value   RBC 5.27 (*)    Lymphs Abs 5.2 (*)    All other components within normal limits  COMPREHENSIVE METABOLIC PANEL - Abnormal; Notable for the following components:   Potassium 3.2 (*)    Glucose, Bld 192 (*)    All other components within normal limits    EKG None  Radiology No results found.  Procedures Procedures   Medications Ordered in ED Medications  hydrOXYzine (ATARAX) tablet 25 mg (25 mg Oral Given 08/10/21 0349)  triamcinolone cream (KENALOG) 0.1 % cream (1 application Topical Given 08/10/21 0440)    ED Course  I have reviewed the triage vital signs and the nursing notes.  Pertinent labs & imaging results that were available during my care of the patient were reviewed by me and considered in my medical decision making (see chart for details).    MDM Rules/Calculators/A&P                         Seems consistent with eczema. 2/2 not improving nature, slight tachycardia, checked BUN/bilirubin/hemoglobin all of which are reassuring. HR  improved (not reflected in documented vitals) prior to discharge  at 104, suspect the vistaril helped.   Final Clinical Impression(s) / ED Diagnoses Final diagnoses:  Eczema, unspecified type    Rx / DC Orders ED Discharge Orders          Ordered    triamcinolone 0.1%-Eucerin equivalent 1:1 cream mixture  3 times daily        08/10/21 0434    hydrOXYzine (ATARAX) 25 MG tablet  Every 8 hours PRN        08/10/21 0434             Anel Purohit, Corene Cornea, MD 08/10/21 [phone]

## 2021-08-10 NOTE — ED Triage Notes (Signed)
Pt states she had a brace on her R leg and started itching "about a week ago". States she was seen at another ER at that time and started on Prednisone as well as Benadryl. Returns tonight for continued itching all over.

## 2021-08-12 DIAGNOSIS — R262 Difficulty in walking, not elsewhere classified: Secondary | ICD-10-CM | POA: Diagnosis not present

## 2021-08-12 DIAGNOSIS — M25561 Pain in right knee: Secondary | ICD-10-CM | POA: Diagnosis not present

## 2021-08-12 DIAGNOSIS — M170 Bilateral primary osteoarthritis of knee: Secondary | ICD-10-CM | POA: Diagnosis not present

## 2021-08-12 DIAGNOSIS — M25562 Pain in left knee: Secondary | ICD-10-CM | POA: Diagnosis not present

## 2021-08-12 DIAGNOSIS — G8929 Other chronic pain: Secondary | ICD-10-CM | POA: Diagnosis not present

## 2021-08-15 DIAGNOSIS — M25562 Pain in left knee: Secondary | ICD-10-CM | POA: Diagnosis not present

## 2021-08-15 DIAGNOSIS — G8929 Other chronic pain: Secondary | ICD-10-CM | POA: Diagnosis not present

## 2021-08-15 DIAGNOSIS — R262 Difficulty in walking, not elsewhere classified: Secondary | ICD-10-CM | POA: Diagnosis not present

## 2021-08-15 DIAGNOSIS — M170 Bilateral primary osteoarthritis of knee: Secondary | ICD-10-CM | POA: Diagnosis not present

## 2021-08-15 DIAGNOSIS — M25561 Pain in right knee: Secondary | ICD-10-CM | POA: Diagnosis not present

## 2021-08-18 DIAGNOSIS — G8929 Other chronic pain: Secondary | ICD-10-CM | POA: Diagnosis not present

## 2021-08-18 DIAGNOSIS — M25561 Pain in right knee: Secondary | ICD-10-CM | POA: Diagnosis not present

## 2021-08-18 DIAGNOSIS — M25562 Pain in left knee: Secondary | ICD-10-CM | POA: Diagnosis not present

## 2021-08-18 DIAGNOSIS — R262 Difficulty in walking, not elsewhere classified: Secondary | ICD-10-CM | POA: Diagnosis not present

## 2021-08-18 DIAGNOSIS — M170 Bilateral primary osteoarthritis of knee: Secondary | ICD-10-CM | POA: Diagnosis not present

## 2021-08-19 DIAGNOSIS — R21 Rash and other nonspecific skin eruption: Secondary | ICD-10-CM | POA: Diagnosis not present

## 2021-08-19 DIAGNOSIS — M170 Bilateral primary osteoarthritis of knee: Secondary | ICD-10-CM | POA: Diagnosis not present

## 2021-08-19 DIAGNOSIS — Z6841 Body Mass Index (BMI) 40.0 and over, adult: Secondary | ICD-10-CM | POA: Diagnosis not present

## 2021-08-30 DIAGNOSIS — M170 Bilateral primary osteoarthritis of knee: Secondary | ICD-10-CM | POA: Diagnosis not present

## 2021-08-30 DIAGNOSIS — G8929 Other chronic pain: Secondary | ICD-10-CM | POA: Diagnosis not present

## 2021-08-30 DIAGNOSIS — M25562 Pain in left knee: Secondary | ICD-10-CM | POA: Diagnosis not present

## 2021-08-30 DIAGNOSIS — R262 Difficulty in walking, not elsewhere classified: Secondary | ICD-10-CM | POA: Diagnosis not present

## 2021-08-30 DIAGNOSIS — M25561 Pain in right knee: Secondary | ICD-10-CM | POA: Diagnosis not present

## 2021-11-14 DIAGNOSIS — L309 Dermatitis, unspecified: Secondary | ICD-10-CM | POA: Diagnosis not present

## 2022-01-09 ENCOUNTER — Encounter: Payer: Self-pay | Admitting: *Deleted

## 2022-02-24 ENCOUNTER — Encounter: Payer: Self-pay | Admitting: Family Medicine

## 2022-02-24 ENCOUNTER — Ambulatory Visit (INDEPENDENT_AMBULATORY_CARE_PROVIDER_SITE_OTHER): Payer: BC Managed Care – PPO | Admitting: Family Medicine

## 2022-02-24 VITALS — BP 190/90 | HR 72 | Ht 62.0 in | Wt 252.4 lb

## 2022-02-24 DIAGNOSIS — Z1159 Encounter for screening for other viral diseases: Secondary | ICD-10-CM

## 2022-02-24 DIAGNOSIS — Z114 Encounter for screening for human immunodeficiency virus [HIV]: Secondary | ICD-10-CM

## 2022-02-24 DIAGNOSIS — Z1211 Encounter for screening for malignant neoplasm of colon: Secondary | ICD-10-CM

## 2022-02-24 DIAGNOSIS — L209 Atopic dermatitis, unspecified: Secondary | ICD-10-CM | POA: Insufficient documentation

## 2022-02-24 DIAGNOSIS — Z0001 Encounter for general adult medical examination with abnormal findings: Secondary | ICD-10-CM | POA: Diagnosis not present

## 2022-02-24 DIAGNOSIS — R7301 Impaired fasting glucose: Secondary | ICD-10-CM | POA: Diagnosis not present

## 2022-02-24 DIAGNOSIS — I10 Essential (primary) hypertension: Secondary | ICD-10-CM | POA: Diagnosis not present

## 2022-02-24 DIAGNOSIS — L309 Dermatitis, unspecified: Secondary | ICD-10-CM | POA: Insufficient documentation

## 2022-02-24 DIAGNOSIS — L308 Other specified dermatitis: Secondary | ICD-10-CM | POA: Diagnosis not present

## 2022-02-24 DIAGNOSIS — E559 Vitamin D deficiency, unspecified: Secondary | ICD-10-CM | POA: Diagnosis not present

## 2022-02-24 MED ORDER — TRIAMCINOLONE ACETONIDE 0.1 % EX CREA: 1.0000 | TOPICAL_CREAM | Freq: Two times a day (BID) | CUTANEOUS | 0 refills | Status: DC

## 2022-02-25 LAB — LIPID PANEL
Chol/HDL Ratio: 2.6 ratio (ref 0.0–4.4)
Cholesterol, Total: 209 mg/dL — ABNORMAL HIGH (ref 100–199)
HDL: 79 mg/dL (ref >39)
LDL Chol Calc (NIH): 117 mg/dL — ABNORMAL HIGH (ref 0–99)
Triglycerides: 75 mg/dL (ref 0–149)
VLDL Cholesterol Cal: 13 mg/dL (ref 5–40)

## 2022-02-25 LAB — CBC WITH DIFFERENTIAL/PLATELET
Basophils Absolute: 0.1 10*3/uL (ref 0.0–0.2)
Basos: 1 %
EOS (ABSOLUTE): 0.4 10*3/uL (ref 0.0–0.4)
Eos: 5 %
Hematocrit: 35 % (ref 34.0–46.6)
Hemoglobin: 11.9 g/dL (ref 11.1–15.9)
Immature Grans (Abs): 0 10*3/uL (ref 0.0–0.1)
Immature Granulocytes: 0 %
Lymphocytes Absolute: 3.4 10*3/uL — ABNORMAL HIGH (ref 0.7–3.1)
Lymphs: 50 %
MCH: 27 pg (ref 26.6–33.0)
MCHC: 34 g/dL (ref 31.5–35.7)
MCV: 80 fL (ref 79–97)
Monocytes Absolute: 0.4 10*3/uL (ref 0.1–0.9)
Monocytes: 6 %
Neutrophils Absolute: 2.6 10*3/uL (ref 1.4–7.0)
Neutrophils: 38 %
Platelets: 226 10*3/uL (ref 150–450)
RBC: 4.4 x10E6/uL (ref 3.77–5.28)
RDW: 13.4 % (ref 11.7–15.4)
WBC: 6.8 10*3/uL (ref 3.4–10.8)

## 2022-02-25 LAB — CMP14+EGFR
ALT: 24 IU/L (ref 0–32)
AST: 21 IU/L (ref 0–40)
Albumin/Globulin Ratio: 1.6 (ref 1.2–2.2)
Albumin: 4.4 g/dL (ref 3.8–4.8)
Alkaline Phosphatase: 76 IU/L (ref 44–121)
BUN/Creatinine Ratio: 20 (ref 12–28)
BUN: 13 mg/dL (ref 8–27)
Bilirubin Total: 0.4 mg/dL (ref 0.0–1.2)
CO2: 22 mmol/L (ref 20–29)
Calcium: 9 mg/dL (ref 8.7–10.3)
Chloride: 102 mmol/L (ref 96–106)
Creatinine, Ser: 0.66 mg/dL (ref 0.57–1.00)
Globulin, Total: 2.8 g/dL (ref 1.5–4.5)
Glucose: 123 mg/dL — ABNORMAL HIGH (ref 70–99)
Potassium: 3.4 mmol/L — ABNORMAL LOW (ref 3.5–5.2)
Sodium: 142 mmol/L (ref 134–144)
Total Protein: 7.2 g/dL (ref 6.0–8.5)
eGFR: 100 mL/min/{1.73_m2} (ref >59)

## 2022-02-25 LAB — HIV ANTIBODY (ROUTINE TESTING W REFLEX): HIV Screen 4th Generation wRfx: NONREACTIVE

## 2022-02-25 LAB — TSH+FREE T4
Free T4: 1.27 ng/dL (ref 0.82–1.77)
TSH: 1.52 u[IU]/mL (ref 0.450–4.500)

## 2022-02-25 LAB — HEMOGLOBIN A1C
Est. average glucose Bld gHb Est-mCnc: 160 mg/dL
Hgb A1c MFr Bld: 7.2 % — ABNORMAL HIGH (ref 4.8–5.6)

## 2022-02-25 LAB — HEPATITIS C ANTIBODY: Hep C Virus Ab: UNDETERMINED — AB

## 2022-02-25 LAB — VITAMIN D 25 HYDROXY (VIT D DEFICIENCY, FRACTURES): Vit D, 25-Hydroxy: 13.9 ng/mL — ABNORMAL LOW (ref 30.0–100.0)

## 2022-02-26 ENCOUNTER — Other Ambulatory Visit: Payer: Self-pay | Admitting: Family Medicine

## 2022-02-26 DIAGNOSIS — E785 Hyperlipidemia, unspecified: Secondary | ICD-10-CM

## 2022-02-26 DIAGNOSIS — E119 Type 2 diabetes mellitus without complications: Secondary | ICD-10-CM

## 2022-02-26 DIAGNOSIS — E559 Vitamin D deficiency, unspecified: Secondary | ICD-10-CM

## 2022-02-26 DIAGNOSIS — Z1159 Encounter for screening for other viral diseases: Secondary | ICD-10-CM

## 2022-02-26 MED ORDER — ROSUVASTATIN CALCIUM 10 MG PO TABS: 10.0000 mg | ORAL_TABLET | Freq: Every day | ORAL | 3 refills | Status: DC

## 2022-02-26 MED ORDER — VITAMIN D (ERGOCALCIFEROL) 1.25 MG (50000 UNIT) PO CAPS: 50000.0000 [IU] | ORAL_CAPSULE | ORAL | 1 refills | Status: DC

## 2022-02-26 MED ORDER — METFORMIN HCL 500 MG PO TABS: 500.0000 mg | ORAL_TABLET | Freq: Two times a day (BID) | ORAL | 1 refills | Status: DC

## 2022-02-26 NOTE — Progress Notes (Signed)
The 10-year ASCVD risk score (Arnett DK, et al., 2019) is: 18.1%   Values used to calculate the score:     Age: 61 years     Sex: Female     Is Non-Hispanic African American: Yes     Diabetic: No     Tobacco smoker: No     Systolic Blood Pressure: 190 mmHg     Is BP treated: Yes     HDL Cholesterol: 79 mg/dL     Total Cholesterol: 209 mg/dL

## 2022-02-27 ENCOUNTER — Encounter (INDEPENDENT_AMBULATORY_CARE_PROVIDER_SITE_OTHER): Payer: Self-pay | Admitting: *Deleted

## 2022-03-01 ENCOUNTER — Encounter: Payer: Self-pay | Admitting: Emergency Medicine

## 2022-03-01 ENCOUNTER — Ambulatory Visit
Admission: EM | Admit: 2022-03-01 | Discharge: 2022-03-01 | Disposition: A | Discharge status: Home / Self Care | Payer: BC Managed Care – PPO | Attending: Nurse Practitioner | Admitting: Nurse Practitioner

## 2022-03-01 DIAGNOSIS — R21 Rash and other nonspecific skin eruption: Secondary | ICD-10-CM | POA: Diagnosis not present

## 2022-03-01 MED ORDER — TRIAMCINOLONE ACETONIDE 0.5 % EX OINT
1.0000 | TOPICAL_OINTMENT | Freq: Two times a day (BID) | CUTANEOUS | 0 refills | Status: DC
Start: 2022-03-01 — End: 2022-05-15

## 2022-03-01 MED ORDER — DEXAMETHASONE SODIUM PHOSPHATE 10 MG/ML IJ SOLN
10.0000 mg | INTRAMUSCULAR | Status: AC
  Administered 2022-03-01: 10 mg via INTRAMUSCULAR

## 2022-03-01 NOTE — ED Provider Notes (Addendum)
[facility]    CSN: [id number] Arrival date & time: 03/01/22  1234      History   Chief Complaint Chief Complaint  Patient presents with   Rash    HPI Beverly Mccann is a 61 y.o. female.   The history is provided by the patient.    Patient presents for complaints of rash has been present for the past 2 weeks.  Patient is a history of eczema, rashes worsen to her bilateral forearms, legs, and abdomen.  She states that she was seen by her PCP approximately 2 weeks ago and was given a prescription for triamcinolone cream.  She states that she used a cream but it is no longer working.  She continues to complain of itching, drying, and scaling of the skin.  She states that when she gets hot, her symptoms worsen.  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   HYPERLIPIDEMIA 08/18/2006   OBESITY NOS 08/18/2006   Essential hypertension 08/18/2006   ECZEMA 08/18/2006   OSTEOARTHRITIS 08/18/2006   HYPERGLYCEMIA 08/18/2006    Past Surgical History:  Procedure Laterality Date   ABDOMINAL HYSTERECTOMY     COLONOSCOPY  02/12/2012   Procedure: COLONOSCOPY;  Surgeon: Danie Binder, MD;  Location: [facility];  Service: Endoscopy;  Laterality: N/A;  10:30 AM   Cyst removed from right breast      OB History     Gravida  3   Para  3   Term  3   Preterm      AB      Living  3      SAB      IAB      Ectopic      Multiple      Live Births               Home Medications    Prior to Admission medications   Medication Sig Start Date End Date Taking? Authorizing Provider  triamcinolone ointment (KENALOG) 0.5 % Apply 1 Application topically 2 (two) times daily. 03/01/22  Yes Page Lancon-Warren, Alda Sheets, NP  fluticasone (FLONASE) 50 MCG/ACT nasal spray Place 1  spray into both nostrils daily for 14 days. 04/08/20 04/22/20  Avegno, Darrelyn Hillock, FNP  gabapentin (NEURONTIN) 100 MG capsule Take 1 capsule (100 mg total) by mouth 3 (three) times daily. Patient not taking: Reported on 02/24/2022 05/17/21   Chase Picket, MD  hydrOXYzine (ATARAX) 25 MG tablet Take 1 tablet (25 mg total) by mouth every 8 (eight) hours as needed for itching. Patient not taking: Reported on 02/24/2022 08/10/21   Mesner, Corene Cornea, MD  lisinopril (ZESTRIL) 20 MG tablet Take 20 mg by mouth daily. 02/16/22   [provider]  lisinopril-hydrochlorothiazide (ZESTORETIC) 20-25 MG tablet Take 1 tablet by mouth daily. 05/17/21   Chase Picket, MD  metFORMIN (GLUCOPHAGE) 500 MG tablet Take 1 tablet (500 mg total) by mouth 2 (two) times daily with a meal. 02/26/22 04/27/22  Alvira Monday, FNP  methocarbamol (ROBAXIN) 500 MG tablet Take 1 tablet (500 mg total) by mouth 2 (two) times daily. Patient not taking: Reported on 02/24/2022 08/05/16   Shary Decamp, PA-C  rosuvastatin (CRESTOR) 10 MG tablet Take 1 tablet (10 mg total) by mouth daily. 02/26/22  Alvira Monday, FNP  Vitamin D, Ergocalciferol, (DRISDOL) 1.25 MG (50000 UNIT) CAPS capsule Take 1 capsule (50,000 Units total) by mouth every 7 (seven) days. 02/26/22   Alvira Monday, FNP    Family History Family History  Problem Relation Age of Onset   Hypertension Mother    Diabetes Mother    Hypertension Father     Social History Social History   Tobacco Use   Smoking status: Former    Packs/day: 0.50    Years: 15.00    Total pack years: 7.50    Types: Cigarettes    Quit date: 11/12/2011    Years since quitting: 10.3   Smokeless tobacco: Never  Vaping Use   Vaping Use: Never used  Substance Use Topics   Alcohol use: Yes    Comment: occasionally   Drug use: No     Allergies   Patient has no known allergies.   Review of Systems Review of Systems Per HPI  Physical Exam Triage Vital Signs ED Triage Vitals  Enc  Vitals Group     BP 03/01/22 1421 (!) 175/109     Pulse Rate 03/01/22 1421 83     Resp 03/01/22 1421 18     Temp 03/01/22 1421 98 F (36.7 C)     Temp src --      SpO2 03/01/22 1421 96 %     Weight --      Height --      Head Circumference --      Peak Flow --      Pain Score 03/01/22 1419 0     Pain Loc --      Pain Edu? --      Excl. in [place]? --    No data found.  Updated Vital Signs BP (!) 141/82 (BP Location: Right Arm)   Pulse 83   Temp 98 F (36.7 C)   Resp 18   SpO2 96%   Visual Acuity Right Eye Distance:   Left Eye Distance:   Bilateral Distance:    Right Eye Near:   Left Eye Near:    Bilateral Near:     Physical Exam Vitals and nursing note reviewed.  Constitutional:      Appearance: Normal appearance.  HENT:     Head: Normocephalic and atraumatic.  Cardiovascular:     Rate and Rhythm: Regular rhythm.     Pulses: Normal pulses.     Heart sounds: Normal heart sounds.  Pulmonary:     Effort: Pulmonary effort is normal.     Breath sounds: Normal breath sounds.  Abdominal:     General: Bowel sounds are normal.     Palpations: Abdomen is soft.  Skin:    General: Skin is warm and dry.     Findings: Rash present. Rash is scaling.     Comments: Eczematous rash to the bilateral upper extremities, abdomen and neckline.Excoriations present.   Neurological:     General: No focal deficit present.     Mental Status: She is alert and oriented to person, place, and time.  Psychiatric:        Mood and Affect: Mood normal.        Behavior: Behavior normal.      UC Treatments / Results  Labs (all labs ordered are listed, but only abnormal results are displayed) Labs Reviewed - No data to display  EKG   Radiology No results found.  Procedures Procedures (including critical care time)  Medications Ordered in UC  Medications  dexamethasone (DECADRON) injection 10 mg (10 mg Intramuscular Given 03/01/22 1519)    Initial Impression / Assessment and  Plan / UC Course  I have reviewed the triage vital signs and the nursing notes.  Pertinent labs & imaging results that were available during my care of the patient were reviewed by me and considered in my medical decision making (see chart for details).  Symptoms are consistent with eczema flare.  Will prescribe triamcinolone 0.5% instead of 0.1% to see if this helps with her symptoms.  Decadron 10 mg injection given for her itching.  Supportive care recommendations were provided to the patient.  Patient was advised to follow-up with her PCP for her continued symptoms and for recheck of her blood pressure.  Follow-up as needed. Final Clinical Impressions(s) / UC Diagnoses   Final diagnoses:  Rash and nonspecific skin eruption     Discharge Instructions      Apply medication as prescribed. May also take over-the-counter Zyrtec to help with itching. Avoid hot baths or showers while symptoms persist.  Recommend taking lukewarm baths. May apply cool cloths to the area to help with itching or discomfort. Avoid scratching, rubbing, or manipulating the areas while symptoms persist. Recommend Aveeno colloidal oatmeal bath to use to help with drying and itching. Follow-up with your primary care physician if your symptoms worsen or do not improve.  As discussed, you may need follow-up with dermatology.     ED Prescriptions     Medication Sig Dispense Auth. Provider   triamcinolone ointment (KENALOG) 0.5 % Apply 1 Application topically 2 (two) times daily. 45 g Ameria Sanjurjo-Warren, Alda Coger, NP      PDMP not reviewed this encounter.   Tish Men, NP 03/01/22 1500    Tish Men, NP 03/01/22 1539

## 2022-03-01 NOTE — ED Triage Notes (Signed)
Pt is present today with a rash on her arms and legs. Pt states that her flare started this week.

## 2022-03-01 NOTE — Discharge Instructions (Addendum)
Apply medication as prescribed. May also take over-the-counter Zyrtec to help with itching. Avoid hot baths or showers while symptoms persist.  Recommend taking lukewarm baths. May apply cool cloths to the area to help with itching or discomfort. Avoid scratching, rubbing, or manipulating the areas while symptoms persist. Recommend Aveeno colloidal oatmeal bath to use to help with drying and itching. Follow-up with your primary care physician if your symptoms worsen or do not improve.  As discussed, you may need follow-up with dermatology.

## 2022-03-17 ENCOUNTER — Encounter: Payer: Self-pay | Admitting: Family Medicine

## 2022-03-17 ENCOUNTER — Ambulatory Visit (INDEPENDENT_AMBULATORY_CARE_PROVIDER_SITE_OTHER): Payer: BC Managed Care – PPO | Admitting: Family Medicine

## 2022-03-17 VITALS — BP 148/72 | HR 75 | Ht 64.0 in | Wt 243.0 lb

## 2022-03-17 DIAGNOSIS — Z1211 Encounter for screening for malignant neoplasm of colon: Secondary | ICD-10-CM | POA: Diagnosis not present

## 2022-03-17 DIAGNOSIS — Z1231 Encounter for screening mammogram for malignant neoplasm of breast: Secondary | ICD-10-CM | POA: Diagnosis not present

## 2022-03-17 DIAGNOSIS — I10 Essential (primary) hypertension: Secondary | ICD-10-CM

## 2022-03-17 MED ORDER — OLMESARTAN-AMLODIPINE-HCTZ 40-5-25 MG PO TABS: 1.0000 | ORAL_TABLET | Freq: Every day | ORAL | 1 refills | Status: DC

## 2022-03-17 MED ORDER — OLMESARTAN MEDOXOMIL-HCTZ 40-12.5 MG PO TABS: 1.0000 | ORAL_TABLET | Freq: Every day | ORAL | 1 refills | Status: DC

## 2022-03-17 NOTE — Progress Notes (Signed)
Established Patient Office Visit  Subjective:  Patient ID: Beverly Mccann, female    DOB: 1961/04/15  Age: 61 y.o. MRN: [id number]  CC:  Chief Complaint  Patient presents with   Follow-up    Pt states she has been making healthy choices, has lost weight since last visit. Pt has concerns about bp. Due for pap smear will have done at a later visit.     HPI Beverly Mccann is a 61 y.o. female with past medical history of HTN presents for f/u of chronic medical conditions.  HTN: reports taking Lisinopril 20 mg and lisinopril-hydrochlorothiazide 20-61m. Reports not checking BP at home. Reports increasing physical activities, eliminating salt from her diet, and eating healthier. Denies headaches, dizziness, and blurred vision. Pt has lost 9 lbs since the last visit.   Past Medical History:  Diagnosis Date   Hypercholesteremia    Hypertension     Past Surgical History:  Procedure Laterality Date   ABDOMINAL HYSTERECTOMY     COLONOSCOPY  02/12/2012   Procedure: COLONOSCOPY;  Surgeon: SDanie Binder MD;  Location: [facility];  Service: Endoscopy;  Laterality: N/A;  10:30 AM   Cyst removed from right breast      Family History  Problem Relation Age of Onset   Hypertension Mother    Diabetes Mother    Hypertension Father     Social History   Socioeconomic History   Marital status: Married    Spouse name: Not on file   Number of children: Not on file   Years of education: Not on file   Highest education level: Not on file  Occupational History   Not on file  Tobacco Use   Smoking status: Former    Packs/day: 0.50    Years: 15.00    Total pack years: 7.50    Types: Cigarettes    Quit date: 11/12/2011    Years since quitting: 10.3   Smokeless tobacco: Never  Vaping Use   Vaping Use: Never used  Substance and Sexual Activity   Alcohol use: Yes    Comment: occasionally   Drug use: No   Sexual activity: Yes    Birth control/protection: Surgical  Other Topics Concern    Not on file  Social History Narrative   Not on file   Social Determinants of Health   Financial Resource Strain: Not on file  Food Insecurity: Not on file  Transportation Needs: Not on file  Physical Activity: Not on file  Stress: Not on file  Social Connections: Not on file  Intimate Partner Violence: Not on file    Outpatient Medications Prior to Visit  Medication Sig Dispense Refill   gabapentin (NEURONTIN) 100 MG capsule Take 1 capsule (100 mg total) by mouth 3 (three) times daily. 90 capsule 1   hydrOXYzine (ATARAX) 25 MG tablet Take 1 tablet (25 mg total) by mouth every 8 (eight) hours as needed for itching. 30 tablet 0   metFORMIN (GLUCOPHAGE) 500 MG tablet Take 1 tablet (500 mg total) by mouth 2 (two) times daily with a meal. 60 tablet 1   methocarbamol (ROBAXIN) 500 MG tablet Take 1 tablet (500 mg total) by mouth 2 (two) times daily. 20 tablet 0   rosuvastatin (CRESTOR) 10 MG tablet Take 1 tablet (10 mg total) by mouth daily. 90 tablet 3   triamcinolone ointment (KENALOG) 0.5 % Apply 1 Application topically 2 (two) times daily. 45 g 0   Vitamin D, Ergocalciferol, (DRISDOL) 1.25 MG (50000  UNIT) CAPS capsule Take 1 capsule (50,000 Units total) by mouth every 7 (seven) days. 5 capsule 1   lisinopril (ZESTRIL) 20 MG tablet Take 20 mg by mouth daily.     lisinopril-hydrochlorothiazide (ZESTORETIC) 20-25 MG tablet Take 1 tablet by mouth daily. 30 tablet 1   fluticasone (FLONASE) 50 MCG/ACT nasal spray Place 1 spray into both nostrils daily for 14 days. 16 g 0   No facility-administered medications prior to visit.    No Known Allergies  ROS Review of Systems  Constitutional:  Negative for fatigue and fever.  Cardiovascular:  Negative for chest pain and palpitations.  Neurological:  Negative for dizziness and headaches.      Objective:    Physical Exam HENT:     Head: Normocephalic.  Cardiovascular:     Rate and Rhythm: Normal rate and regular rhythm.     Pulses:  Normal pulses.  Pulmonary:     Effort: Pulmonary effort is normal.     Breath sounds: Normal breath sounds.  Neurological:     Mental Status: She is alert.     BP (!) 148/72   Pulse 75   Ht 5' 4"  (1.626 m)   Wt 243 lb (110.2 kg)   SpO2 98%   BMI 41.71 kg/m  Wt Readings from Last 3 Encounters:  03/17/22 243 lb (110.2 kg)  02/24/22 252 lb 6.4 oz (114.5 kg)  08/10/21 245 lb (111.1 kg)    Lab Results  Component Value Date   TSH 1.520 02/24/2022   Lab Results  Component Value Date   WBC 6.8 02/24/2022   HGB 11.9 02/24/2022   HCT 35.0 02/24/2022   MCV 80 02/24/2022   PLT 226 02/24/2022   Lab Results  Component Value Date   NA 142 02/24/2022   K 3.4 (L) 02/24/2022   CO2 22 02/24/2022   GLUCOSE 123 (H) 02/24/2022   BUN 13 02/24/2022   CREATININE 0.66 02/24/2022   BILITOT 0.4 02/24/2022   ALKPHOS 76 02/24/2022   AST 21 02/24/2022   ALT 24 02/24/2022   PROT 7.2 02/24/2022   ALBUMIN 4.4 02/24/2022   CALCIUM 9.0 02/24/2022   ANIONGAP 12 08/10/2021   EGFR 100 02/24/2022   Lab Results  Component Value Date   CHOL 209 (H) 02/24/2022   Lab Results  Component Value Date   HDL 79 02/24/2022   Lab Results  Component Value Date   LDLCALC 117 (H) 02/24/2022   Lab Results  Component Value Date   TRIG 75 02/24/2022   Lab Results  Component Value Date   CHOLHDL 2.6 02/24/2022   Lab Results  Component Value Date   HGBA1C 7.2 (H) 02/24/2022      Assessment & Plan:   Problem List Items Addressed This Visit       Cardiovascular and Mediastinum   Essential hypertension - Primary    Uncontrolled Reports taking Lisinopril 20 mg and lisinopril-hydrochlorothiazide 20-24m Will d/c lisinopril 20 mg and lisinopril-hydrochlorothiazide 20-233mWill start patient on olmesartan-amlodipine-HCTZ 40-5-25 F/u in 1 month Congratulated pt on increasing physical activities, eliminating salt from her diet, and eating healthier Pt has lost 9 lbs since the last visit.        Relevant Medications   Olmesartan-amLODIPine-HCTZ 40-5-25 MG TABS   Other Visit Diagnoses     Breast cancer screening by mammogram       Relevant Orders   MM 3D SCREEN BREAST BILATERAL   Colon cancer screening       Relevant Orders  Cologuard       Meds ordered this encounter  Medications   DISCONTD: olmesartan-hydrochlorothiazide (BENICAR HCT) 40-12.5 MG tablet    Sig: Take 1 tablet by mouth daily.    Dispense:  30 tablet    Refill:  1   Olmesartan-amLODIPine-HCTZ 40-5-25 MG TABS    Sig: Take 1 tablet by mouth daily.    Dispense:  30 tablet    Refill:  1    Follow-up: Return in about 1 month (around 04/17/2022) for BP.    Alvira Monday, FNP

## 2022-03-17 NOTE — Patient Instructions (Addendum)
I appreciate the opportunity to provide care to you today!    Follow up:  1 months  Please stop taking lisinopril 20 mg and lisinopril- Hydrochlorothiazide 20-25  Please start taking the medication that that is sent to your pharmacy     Please continue to a heart-healthy diet and increase your physical activities. Try to exercise for 8mns at least three times a week.      It was a pleasure to see you and I look forward to continuing to work together on your health and well-being. Please do not hesitate to call the office if you need care or have questions about your care.   Have a wonderful day and week. With Gratitude, GAlvira MondayMSN, FNP-BC

## 2022-03-17 NOTE — Assessment & Plan Note (Addendum)
Uncontrolled Reports taking Lisinopril 20 mg and lisinopril-hydrochlorothiazide 20-'25mg'$  Will d/c lisinopril 20 mg and lisinopril-hydrochlorothiazide 20-'25mg'$  Will start patient on olmesartan-amlodipine-HCTZ 40-5-25 F/u in 1 month Congratulated pt on increasing physical activities, eliminating salt from her diet, and eating healthier Pt has lost 9 lbs since the last visit.

## 2022-04-14 ENCOUNTER — Ambulatory Visit (INDEPENDENT_AMBULATORY_CARE_PROVIDER_SITE_OTHER): Payer: BC Managed Care – PPO

## 2022-04-14 ENCOUNTER — Ambulatory Visit
Admission: EM | Admit: 2022-04-14 | Discharge: 2022-04-14 | Disposition: A | Discharge status: Home / Self Care | Payer: BC Managed Care – PPO | Attending: Urgent Care | Admitting: Urgent Care

## 2022-04-14 ENCOUNTER — Other Ambulatory Visit: Payer: Self-pay

## 2022-04-14 ENCOUNTER — Encounter: Payer: Self-pay | Admitting: Emergency Medicine

## 2022-04-14 DIAGNOSIS — M79672 Pain in left foot: Secondary | ICD-10-CM

## 2022-04-14 DIAGNOSIS — M7989 Other specified soft tissue disorders: Secondary | ICD-10-CM | POA: Diagnosis not present

## 2022-04-14 MED ORDER — NAPROXEN 500 MG PO TABS: 500.0000 mg | ORAL_TABLET | Freq: Two times a day (BID) | ORAL | 0 refills | Status: DC

## 2022-04-14 NOTE — ED Provider Notes (Signed)
[place]   MRN: [id number] DOB: 1961/01/05  Subjective:   Beverly Mccann is a 61 y.o. female presenting for 3-day history of acute onset left foot pain.  No fall, trauma, bruising, wounds, rashes.  Patient works long 12-hour shifts and is either walking or standing for long periods of time.  She makes a lot of parts.  No history of gout.  No history of arthritis in her foot.  Patient does not eat a lot of processed meats, seafood.  She has an occasional drink of alcohol.  No current facility-administered medications for this encounter.  Current Outpatient Medications:    fluticasone (FLONASE) 50 MCG/ACT nasal spray, Place 1 spray into both nostrils daily for 14 days., Disp: 16 g, Rfl: 0   gabapentin (NEURONTIN) 100 MG capsule, Take 1 capsule (100 mg total) by mouth 3 (three) times daily., Disp: 90 capsule, Rfl: 1   hydrOXYzine (ATARAX) 25 MG tablet, Take 1 tablet (25 mg total) by mouth every 8 (eight) hours as needed for itching., Disp: 30 tablet, Rfl: 0   metFORMIN (GLUCOPHAGE) 500 MG tablet, Take 1 tablet (500 mg total) by mouth 2 (two) times daily with a meal., Disp: 60 tablet, Rfl: 1   methocarbamol (ROBAXIN) 500 MG tablet, Take 1 tablet (500 mg total) by mouth 2 (two) times daily., Disp: 20 tablet, Rfl: 0   Olmesartan-amLODIPine-HCTZ 40-5-25 MG TABS, Take 1 tablet by mouth daily., Disp: 30 tablet, Rfl: 1   rosuvastatin (CRESTOR) 10 MG tablet, Take 1 tablet (10 mg total) by mouth daily., Disp: 90 tablet, Rfl: 3   triamcinolone ointment (KENALOG) 0.5 %, Apply 1 Application topically 2 (two) times daily., Disp: 45 g, Rfl: 0   Vitamin D, Ergocalciferol, (DRISDOL) 1.25 MG (50000 UNIT) CAPS capsule, Take 1 capsule (50,000 Units total) by mouth every 7 (seven) days., Disp: 5 capsule, Rfl: 1   No Known Allergies  Past Medical History:  Diagnosis Date   Hypercholesteremia    Hypertension      Past Surgical History:  Procedure Laterality Date   ABDOMINAL HYSTERECTOMY      COLONOSCOPY  02/12/2012   Procedure: COLONOSCOPY;  Surgeon: Danie Binder, MD;  Location: [facility];  Service: Endoscopy;  Laterality: N/A;  10:30 AM   Cyst removed from right breast      Family History  Problem Relation Age of Onset   Hypertension Mother    Diabetes Mother    Hypertension Father     Social History   Tobacco Use   Smoking status: Former    Packs/day: 0.50    Years: 15.00    Total pack years: 7.50    Types: Cigarettes    Quit date: 11/12/2011    Years since quitting: 10.4   Smokeless tobacco: Never  Vaping Use   Vaping Use: Never used  Substance Use Topics   Alcohol use: Yes    Comment: occasionally   Drug use: No    ROS   Objective:   Vitals: BP 138/76 (BP Location: Right Arm)   Pulse 97   Temp 98.4 F (36.9 C) (Oral)   Resp 18   SpO2 98%   Physical Exam Constitutional:      General: She is not in acute distress.    Appearance: Normal appearance. She is well-developed. She is not ill-appearing, toxic-appearing or diaphoretic.  HENT:     Head: Normocephalic and atraumatic.     Nose: Nose normal.     Mouth/Throat:     Mouth:  Mucous membranes are moist.  Eyes:     General: No scleral icterus.       Right eye: No discharge.        Left eye: No discharge.     Extraocular Movements: Extraocular movements intact.  Cardiovascular:     Rate and Rhythm: Normal rate.  Pulmonary:     Effort: Pulmonary effort is normal.  Musculoskeletal:     Left foot: Normal range of motion and normal capillary refill. Swelling (trace over distal plantar surface of foot as outlined), tenderness (over areas outlined) and bony tenderness present. No deformity, bunion, laceration or crepitus.       Feet:     Comments: No wounds, erythema, warmth, drainage of pus or bleeding, ecchymosis.  Skin:    General: Skin is warm and dry.  Neurological:     General: No focal deficit present.     Mental Status: She is alert and oriented to person, place, and time.   Psychiatric:        Mood and Affect: Mood normal.        Behavior: Behavior normal.     DG Foot Complete Left  Result Date: 04/14/2022 CLINICAL DATA:  Pain and swelling.  No injury. EXAM: LEFT FOOT - COMPLETE 3+ VIEW COMPARISON:  None Available. FINDINGS: There is no evidence of fracture or dislocation. There is no evidence of arthropathy or other focal bone abnormality. Soft tissues are unremarkable. IMPRESSION: Negative. Electronically Signed   By: Dorise Bullion III M.D.   On: 04/14/2022 17:48     Assessment and Plan :   PDMP not reviewed this encounter.  1. Left foot pain    Low suspicion for gout given lack of warmth, erythema and location of her foot pain.  X-ray was negative for fracture, stress fracture, arthritis.  Suspect inflammatory pain related to the nature of her work.  Patient possibly has Morton's neuroma but was not appreciated on exam or x-ray.  Recommended conservative management, RICE method, naproxen for pain and inflammation. Counseled patient on potential for adverse effects with medications prescribed/recommended today, ER and return-to-clinic precautions discussed, patient verbalized understanding.    Jaynee Eagles, PA-C 04/14/22 1801

## 2022-04-14 NOTE — ED Triage Notes (Signed)
Pt here for left foot pain at great toe area x 3 days; denies obvious injury

## 2022-04-20 DIAGNOSIS — Z1211 Encounter for screening for malignant neoplasm of colon: Secondary | ICD-10-CM | POA: Diagnosis not present

## 2022-04-21 ENCOUNTER — Ambulatory Visit: Payer: BC Managed Care – PPO | Admitting: Family Medicine

## 2022-04-27 ENCOUNTER — Ambulatory Visit (HOSPITAL_COMMUNITY): Payer: BC Managed Care – PPO

## 2022-04-29 LAB — COLOGUARD: COLOGUARD: NEGATIVE

## 2022-05-02 ENCOUNTER — Other Ambulatory Visit: Payer: Self-pay

## 2022-05-02 DIAGNOSIS — E559 Vitamin D deficiency, unspecified: Secondary | ICD-10-CM

## 2022-05-02 MED ORDER — VITAMIN D (ERGOCALCIFEROL) 1.25 MG (50000 UNIT) PO CAPS: 50000.0000 [IU] | ORAL_CAPSULE | ORAL | 1 refills | Status: DC

## 2022-05-02 NOTE — Progress Notes (Signed)
Please inform the patient that her cologurad is negative for colon cancer.

## 2022-05-04 ENCOUNTER — Ambulatory Visit
Admission: EM | Admit: 2022-05-04 | Discharge: 2022-05-04 | Disposition: A | Discharge status: Home / Self Care | Payer: BC Managed Care – PPO | Attending: Family Medicine | Admitting: Family Medicine

## 2022-05-04 DIAGNOSIS — K047 Periapical abscess without sinus: Secondary | ICD-10-CM

## 2022-05-04 MED ORDER — AMOXICILLIN-POT CLAVULANATE 875-125 MG PO TABS: 1.0000 | ORAL_TABLET | Freq: Two times a day (BID) | ORAL | 0 refills | Status: DC

## 2022-05-04 MED ORDER — CHLORHEXIDINE GLUCONATE 0.12 % MT SOLN: 15.0000 mL | Freq: Two times a day (BID) | OROMUCOSAL | 0 refills | Status: DC

## 2022-05-04 NOTE — ED Provider Notes (Signed)
[facility]    CSN: [id number] Arrival date & time: 05/04/22  1020      History   Chief Complaint Chief Complaint  Patient presents with   Dental Pain    HPI Beverly Mccann is a 61 y.o. female.   Patient presenting today with 4-day history of right lower facial swelling, dental pain from a known decaying molar on this side.  Denies fever, chills, drainage, difficulty breathing or swallowing.  Trying over-the-counter pain relievers with no relief.  States she has been saving up to get into the dentist and hopes to get an appointment soon.     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   HYPERLIPIDEMIA 08/18/2006   OBESITY NOS 08/18/2006   Essential hypertension 08/18/2006   ECZEMA 08/18/2006   OSTEOARTHRITIS 08/18/2006   HYPERGLYCEMIA 08/18/2006    Past Surgical History:  Procedure Laterality Date   ABDOMINAL HYSTERECTOMY     COLONOSCOPY  02/12/2012   Procedure: COLONOSCOPY;  Surgeon: Danie Binder, MD;  [facility]: [facility];  Service: Endoscopy;  Laterality: N/A;  10:30 AM   Cyst removed from right breast      OB History     Gravida  3   Para  3   Term  3   Preterm      AB      Living  3      SAB      IAB      Ectopic      Multiple      Live Births               Home Medications    Prior to Admission medications   Medication Sig Start Date End Date Taking? Authorizing Provider  amoxicillin-clavulanate (AUGMENTIN) 875-125 MG tablet Take 1 tablet by mouth every 12 (twelve) hours. 05/04/22  Yes Volney American, PA-C  chlorhexidine (PERIDEX) 0.12 % solution Use as directed 15 mLs in the mouth or throat 2 (two) times daily. 05/04/22  Yes Volney American, PA-C  fluticasone [facility]) 50 MCG/ACT nasal spray Place 1 spray  into both nostrils daily for 14 days. 04/08/20 04/22/20  Avegno, Darrelyn Hillock, FNP  gabapentin (NEURONTIN) 100 MG capsule Take 1 capsule (100 mg total) by mouth 3 (three) times daily. 05/17/21   Lamptey, Myrene Galas, MD  hydrOXYzine (ATARAX) 25 MG tablet Take 1 tablet (25 mg total) by mouth every 8 (eight) hours as needed for itching. 08/10/21   Mesner, Corene Cornea, MD  metFORMIN (GLUCOPHAGE) 500 MG tablet Take 1 tablet (500 mg total) by mouth 2 (two) times daily with a meal. 02/26/22 04/27/22  Alvira Monday, FNP  methocarbamol (ROBAXIN) 500 MG tablet Take 1 tablet (500 mg total) by mouth 2 (two) times daily. 08/05/16   Shary Decamp, PA-C  naproxen (NAPROSYN) 500 MG tablet Take 1 tablet (500 mg total) by mouth 2 (two) times daily with a meal. 04/14/22   Jaynee Eagles, PA-C  Olmesartan-amLODIPine-HCTZ 40-5-25 MG TABS Take 1 tablet by mouth daily. 03/17/22   Alvira Monday, FNP  rosuvastatin (CRESTOR) 10 MG tablet Take 1 tablet (10 mg total) by mouth daily. 02/26/22   Alvira Monday, FNP  triamcinolone ointment (KENALOG) 0.5 % Apply 1 Application topically 2 (two) times daily. 03/01/22   Leath-Warren, Adonis Brook  J, NP  Vitamin D, Ergocalciferol, (DRISDOL) 1.25 MG (50000 UNIT) CAPS capsule Take 1 capsule (50,000 Units total) by mouth every 7 (seven) days. 05/02/22   Alvira Monday, FNP    Family History Family History  Problem Relation Age of Onset   Hypertension Mother    Diabetes Mother    Hypertension Father     Social History Social History   Tobacco Use   Smoking status: Former    Packs/day: 0.50    Years: 15.00    Total pack years: 7.50    Types: Cigarettes    Quit date: 11/12/2011    Years since quitting: 10.4   Smokeless tobacco: Never  Vaping Use   Vaping Use: Never used  Substance Use Topics   Alcohol use: Yes    Comment: occasionally   Drug use: No     Allergies   Patient has no known allergies.   Review of Systems Review of Systems Per HPI  Physical Exam Triage Vital Signs ED  Triage Vitals  Enc Vitals Group     BP 05/04/22 1102 127/68     Pulse Rate 05/04/22 1102 66     Resp 05/04/22 1102 18     Temp 05/04/22 1102 98.4 F (36.9 C)     Temp Source 05/04/22 1102 Oral     SpO2 05/04/22 1102 99 %     Weight --      Height --      Head Circumference --      Peak Flow --      Pain Score 05/04/22 1100 10     Pain Loc --      Pain Edu? --      Excl. in [place]? --    No data found.  Updated Vital Signs BP 127/68 (BP Location: Right Arm)   Pulse 66   Temp 98.4 F (36.9 C) (Oral)   Resp 18   SpO2 99%   Visual Acuity Right Eye Distance:   Left Eye Distance:   Bilateral Distance:    Right Eye Near:   Left Eye Near:    Bilateral Near:     Physical Exam Vitals and nursing note reviewed.  Constitutional:      Appearance: Normal appearance. She is not ill-appearing.  HENT:     Head: Atraumatic.     Mouth/Throat:     Mouth: Mucous membranes are moist.     Comments: Decaying molar right lower jaw, gingival erythema surrounding Eyes:     Extraocular Movements: Extraocular movements intact.     Conjunctiva/sclera: Conjunctivae normal.  Cardiovascular:     Rate and Rhythm: Normal rate and regular rhythm.     Heart sounds: Normal heart sounds.  Pulmonary:     Effort: Pulmonary effort is normal.     Breath sounds: Normal breath sounds.  Musculoskeletal:        General: Normal range of motion.     Cervical back: Normal range of motion and neck supple.  Lymphadenopathy:     Cervical: No cervical adenopathy.  Skin:    General: Skin is warm and dry.  Neurological:     Mental Status: She is alert and oriented to person, place, and time.  Psychiatric:        Mood and Affect: Mood normal.        Thought Content: Thought content normal.        Judgment: Judgment normal.      UC Treatments / Results  Labs (all labs ordered  are listed, but only abnormal results are displayed) Labs Reviewed - No data to display  EKG   Radiology No results  found.  Procedures Procedures (including critical care time)  Medications Ordered in UC Medications - No data to display  Initial Impression / Assessment and Plan / UC Course  I have reviewed the triage vital signs and the nursing notes.  Pertinent labs & imaging results that were available during my care of the patient were reviewed by me and considered in my medical decision making (see chart for details).     We will treat with Augmentin, Peridex rinse, over-the-counter pain relievers.  Close dental follow-up recommended.  Return for worsening symptoms.  Final Clinical Impressions(s) / UC Diagnoses   Final diagnoses:  Dental infection   Discharge Instructions   None    ED Prescriptions     Medication Sig Dispense Auth. Provider   amoxicillin-clavulanate (AUGMENTIN) 875-125 MG tablet Take 1 tablet by mouth every 12 (twelve) hours. 14 tablet Volney American, [place]   chlorhexidine (PERIDEX) 0.12 % solution Use as directed 15 mLs in the mouth or throat 2 (two) times daily. 120 mL Volney American, [place]      PDMP not reviewed this encounter.   Volney American, [place] 05/04/22 1750

## 2022-05-04 NOTE — ED Triage Notes (Signed)
Pt present right side facial swelling and tooth pain. Symptoms started on Monday. Pt tried OTC medication with no relief.

## 2022-05-06 ENCOUNTER — Other Ambulatory Visit: Payer: Self-pay | Admitting: Family Medicine

## 2022-05-06 DIAGNOSIS — I10 Essential (primary) hypertension: Secondary | ICD-10-CM

## 2022-05-11 ENCOUNTER — Telehealth: Payer: Self-pay | Admitting: Family Medicine

## 2022-05-11 NOTE — Telephone Encounter (Signed)
Spoke to pt let her know refills are at [facility].

## 2022-05-11 NOTE — Telephone Encounter (Signed)
Patient needs refill on  Olmesartan-amLODIPine-HCTZ 40-5-25 MG TABS

## 2022-05-15 ENCOUNTER — Ambulatory Visit (INDEPENDENT_AMBULATORY_CARE_PROVIDER_SITE_OTHER): Payer: BC Managed Care – PPO | Admitting: Family Medicine

## 2022-05-15 ENCOUNTER — Encounter: Payer: Self-pay | Admitting: Family Medicine

## 2022-05-15 VITALS — BP 132/78 | HR 87 | Ht 62.0 in | Wt 239.1 lb

## 2022-05-15 DIAGNOSIS — I10 Essential (primary) hypertension: Secondary | ICD-10-CM

## 2022-05-15 DIAGNOSIS — L308 Other specified dermatitis: Secondary | ICD-10-CM | POA: Diagnosis not present

## 2022-05-15 DIAGNOSIS — E119 Type 2 diabetes mellitus without complications: Secondary | ICD-10-CM

## 2022-05-15 MED ORDER — METFORMIN HCL 500 MG PO TABS: 500.0000 mg | ORAL_TABLET | Freq: Two times a day (BID) | ORAL | 1 refills | Status: DC

## 2022-05-15 MED ORDER — TRIAMCINOLONE ACETONIDE 0.5 % EX OINT: 1.0000 | TOPICAL_OINTMENT | Freq: Two times a day (BID) | CUTANEOUS | 0 refills | Status: DC

## 2022-05-15 NOTE — Patient Instructions (Addendum)
I appreciate the opportunity to provide care to you today!    Follow up:  3 months CPE  Labs: Next visit   Keep up the good work!!  Please pick up your medications at the pharmacy   Please continue to a heart-healthy diet and increase your physical activities. Try to exercise for 18mns at least three times a week.      It was a pleasure to see you and I look forward to continuing to work together on your health and well-being. Please do not hesitate to call the office if you need care or have questions about your care.   Have a wonderful day and week. With Gratitude, GAlvira MondayMSN, FNP-BC

## 2022-05-15 NOTE — Assessment & Plan Note (Signed)
Controlled Encouraged to continue treatment regimen

## 2022-05-15 NOTE — Progress Notes (Signed)
Established Patient Office Visit  Subjective:  Patient ID: Beverly Mccann, female    DOB: 24-Nov-1960  Age: 61 y.o. MRN: [id number]  CC:  Chief Complaint  Patient presents with   Follow-up    Pt states she has been taking bp at home and has had good readings, states she is doing well with new medication. Needs mammogram scheduled today.    HPI Beverly Mccann is a 61 y.o. female with past medical history of essential hypertension, Eczema, osteoarthritis presents for f/u of  chronic medical conditions. HTN: BP was controlled today. Complaint with the treatment regimen. Notes to checking her BP at home and exercising regularly. Adherence to Dash's eating plan.      Past Medical History:  Diagnosis Date   Hypercholesteremia    Hypertension     Past Surgical History:  Procedure Laterality Date   ABDOMINAL HYSTERECTOMY     COLONOSCOPY  02/12/2012   Procedure: COLONOSCOPY;  Surgeon: Danie Binder, MD;  Location: [facility];  Service: Endoscopy;  Laterality: N/A;  10:30 AM   Cyst removed from right breast      Family History  Problem Relation Age of Onset   Hypertension Mother    Diabetes Mother    Hypertension Father     Social History   Socioeconomic History   Marital status: Married    Spouse name: Not on file   Number of children: Not on file   Years of education: Not on file   Highest education level: Not on file  Occupational History   Not on file  Tobacco Use   Smoking status: Former    Packs/day: 0.50    Years: 15.00    Total pack years: 7.50    Types: Cigarettes    Quit date: 11/12/2011    Years since quitting: 10.5   Smokeless tobacco: Never  Vaping Use   Vaping Use: Never used  Substance and Sexual Activity   Alcohol use: Yes    Comment: occasionally   Drug use: No   Sexual activity: Yes    Birth control/protection: Surgical  Other Topics Concern   Not on file  Social History Narrative   Not on file   Social Determinants of Health   Financial  Resource Strain: Not on file  Food Insecurity: Not on file  Transportation Needs: Not on file  Physical Activity: Not on file  Stress: Not on file  Social Connections: Not on file  Intimate Partner Violence: Not on file    Outpatient Medications Prior to Visit  Medication Sig Dispense Refill   Olmesartan-amLODIPine-HCTZ 40-5-25 MG TABS Take 1 tablet by mouth once daily 30 tablet 4   Vitamin D, Ergocalciferol, (DRISDOL) 1.25 MG (50000 UNIT) CAPS capsule Take 1 capsule (50,000 Units total) by mouth every 7 (seven) days. 5 capsule 1   amoxicillin-clavulanate (AUGMENTIN) 875-125 MG tablet Take 1 tablet by mouth every 12 (twelve) hours. 14 tablet 0   chlorhexidine (PERIDEX) 0.12 % solution Use as directed 15 mLs in the mouth or throat 2 (two) times daily. 120 mL 0   gabapentin (NEURONTIN) 100 MG capsule Take 1 capsule (100 mg total) by mouth 3 (three) times daily. 90 capsule 1   hydrOXYzine (ATARAX) 25 MG tablet Take 1 tablet (25 mg total) by mouth every 8 (eight) hours as needed for itching. 30 tablet 0   metFORMIN (GLUCOPHAGE) 500 MG tablet Take 1 tablet (500 mg total) by mouth 2 (two) times daily with a meal. 60  tablet 1   methocarbamol (ROBAXIN) 500 MG tablet Take 1 tablet (500 mg total) by mouth 2 (two) times daily. 20 tablet 0   naproxen (NAPROSYN) 500 MG tablet Take 1 tablet (500 mg total) by mouth 2 (two) times daily with a meal. 30 tablet 0   rosuvastatin (CRESTOR) 10 MG tablet Take 1 tablet (10 mg total) by mouth daily. 90 tablet 3   triamcinolone ointment (KENALOG) 0.5 % Apply 1 Application topically 2 (two) times daily. 45 g 0   fluticasone (FLONASE) 50 MCG/ACT nasal spray Place 1 spray into both nostrils daily for 14 days. 16 g 0   No facility-administered medications prior to visit.    No Known Allergies  ROS Review of Systems  Constitutional:  Negative for chills, fatigue and fever.  Gastrointestinal:  Negative for nausea and vomiting.  Neurological:  Negative for  dizziness, tremors and headaches.  Psychiatric/Behavioral:  Negative for self-injury and suicidal ideas.       Objective:    Physical Exam HENT:     Head: Normocephalic.  Cardiovascular:     Rate and Rhythm: Normal rate and regular rhythm.     Pulses: Normal pulses.     Heart sounds: Normal heart sounds.  Pulmonary:     Effort: Pulmonary effort is normal.     Breath sounds: Normal breath sounds.  Neurological:     Mental Status: She is alert.     BP 132/78   Pulse 87   Ht 5' 2" (1.575 m)   Wt 239 lb 1.9 oz (108.5 kg)   SpO2 96%   BMI 43.74 kg/m  Wt Readings from Last 3 Encounters:  05/15/22 239 lb 1.9 oz (108.5 kg)  03/17/22 243 lb (110.2 kg)  02/24/22 252 lb 6.4 oz (114.5 kg)    Lab Results  Component Value Date   TSH 1.520 02/24/2022   Lab Results  Component Value Date   WBC 6.8 02/24/2022   HGB 11.9 02/24/2022   HCT 35.0 02/24/2022   MCV 80 02/24/2022   PLT 226 02/24/2022   Lab Results  Component Value Date   NA 142 02/24/2022   K 3.4 (L) 02/24/2022   CO2 22 02/24/2022   GLUCOSE 123 (H) 02/24/2022   BUN 13 02/24/2022   CREATININE 0.66 02/24/2022   BILITOT 0.4 02/24/2022   ALKPHOS 76 02/24/2022   AST 21 02/24/2022   ALT 24 02/24/2022   PROT 7.2 02/24/2022   ALBUMIN 4.4 02/24/2022   CALCIUM 9.0 02/24/2022   ANIONGAP 12 08/10/2021   EGFR 100 02/24/2022   Lab Results  Component Value Date   CHOL 209 (H) 02/24/2022   Lab Results  Component Value Date   HDL 79 02/24/2022   Lab Results  Component Value Date   LDLCALC 117 (H) 02/24/2022   Lab Results  Component Value Date   TRIG 75 02/24/2022   Lab Results  Component Value Date   CHOLHDL 2.6 02/24/2022   Lab Results  Component Value Date   HGBA1C 7.2 (H) 02/24/2022      Assessment & Plan:   Problem List Items Addressed This Visit       Cardiovascular and Mediastinum   Essential hypertension - Primary    Controlled Encouraged to continue treatment regimen         Musculoskeletal and Integument   Eczema   Relevant Medications   triamcinolone ointment (KENALOG) 0.5 %   Other Visit Diagnoses     Type 2 diabetes mellitus without complication, without   long-term current use of insulin (HCC)       Relevant Medications   metFORMIN (GLUCOPHAGE) 500 MG tablet       Meds ordered this encounter  Medications   triamcinolone ointment (KENALOG) 0.5 %    Sig: Apply 1 Application topically 2 (two) times daily.    Dispense:  45 g    Refill:  0   metFORMIN (GLUCOPHAGE) 500 MG tablet    Sig: Take 1 tablet (500 mg total) by mouth 2 (two) times daily with a meal.    Dispense:  60 tablet    Refill:  1    Follow-up: Return in about 3 months (around 08/14/2022) for CPE.    Gloria  Zarwolo, FNP 

## 2022-05-15 NOTE — Progress Notes (Signed)
blo

## 2022-05-25 ENCOUNTER — Ambulatory Visit (HOSPITAL_COMMUNITY)
Admission: RE | Admit: 2022-05-25 | Discharge: 2022-05-25 | Disposition: A | Discharge status: Home / Self Care | Payer: BC Managed Care – PPO | Source: Ambulatory Visit | Attending: Family Medicine | Admitting: Family Medicine

## 2022-05-25 DIAGNOSIS — Z1231 Encounter for screening mammogram for malignant neoplasm of breast: Secondary | ICD-10-CM | POA: Diagnosis not present

## 2022-06-09 ENCOUNTER — Emergency Department (HOSPITAL_COMMUNITY)
Admission: EM | Admit: 2022-06-09 | Discharge: 2022-06-09 | Disposition: A | Discharge status: Home / Self Care | Payer: BC Managed Care – PPO | Attending: Emergency Medicine | Admitting: Emergency Medicine

## 2022-06-09 ENCOUNTER — Encounter (HOSPITAL_COMMUNITY): Payer: Self-pay

## 2022-06-09 ENCOUNTER — Other Ambulatory Visit: Payer: Self-pay

## 2022-06-09 DIAGNOSIS — E119 Type 2 diabetes mellitus without complications: Secondary | ICD-10-CM | POA: Insufficient documentation

## 2022-06-09 DIAGNOSIS — Z79899 Other long term (current) drug therapy: Secondary | ICD-10-CM | POA: Diagnosis not present

## 2022-06-09 DIAGNOSIS — R21 Rash and other nonspecific skin eruption: Secondary | ICD-10-CM | POA: Insufficient documentation

## 2022-06-09 DIAGNOSIS — Z7984 Long term (current) use of oral hypoglycemic drugs: Secondary | ICD-10-CM | POA: Insufficient documentation

## 2022-06-09 DIAGNOSIS — I10 Essential (primary) hypertension: Secondary | ICD-10-CM | POA: Insufficient documentation

## 2022-06-09 DIAGNOSIS — T7840XA Allergy, unspecified, initial encounter: Secondary | ICD-10-CM

## 2022-06-09 MED ORDER — AMOXICILLIN 500 MG PO CAPS: 500.0000 mg | ORAL_CAPSULE | Freq: Three times a day (TID) | ORAL | 0 refills | Status: DC

## 2022-06-09 MED ORDER — PREDNISONE 20 MG PO TABS
40.0000 mg | ORAL_TABLET | Freq: Once | ORAL | Status: AC
  Administered 2022-06-09: 40 mg via ORAL
  Filled 2022-06-09: qty 2

## 2022-06-09 MED ORDER — AMOXICILLIN 250 MG PO CAPS
500.0000 mg | ORAL_CAPSULE | Freq: Once | ORAL | Status: AC
  Administered 2022-06-09: 500 mg via ORAL
  Filled 2022-06-09: qty 2

## 2022-06-09 MED ORDER — PREDNISONE 10 MG PO TABS: 20.0000 mg | ORAL_TABLET | Freq: Two times a day (BID) | ORAL | 0 refills | Status: DC

## 2022-06-09 NOTE — Discharge Instructions (Addendum)
Stop taking clindamycin.  Begin taking amoxicillin and prednisone as prescribed.  Follow-up with your dentist as scheduled, and return to the ER if symptoms significantly worsen or change.

## 2022-06-09 NOTE — ED Triage Notes (Addendum)
Pt presents to ED with rash to bilateral arms and legs after starting clindamycin 3 days ago for dental infection. Pt is still taking this medication -pt reports rash itches. This medication was added to pt allergies per request.

## 2022-06-09 NOTE — ED Provider Notes (Signed)
[facility] EMERGENCY DEPARTMENT Provider Note   CSN: [id number] Arrival date & time: 06/09/22  2313     History  Chief Complaint  Patient presents with   Rash    Beverly Mccann is a 61 y.o. female.  Patient is a 61 year old female with past medical history of hypertension, diabetes.  Patient presenting today for evaluation of rash.  She was diagnosed with a dental infection earlier this week and started on clindamycin.  She has been taking this for 3 days, then the rash began.  She describes a itchy, raised rash to both arms and torso.  She denies any difficulty breathing or swallowing.  She has taken Benadryl with some relief.  She denies any new contacts or exposures otherwise.  The history is provided by the patient.       Home Medications Prior to Admission medications   Medication Sig Start Date End Date Taking? Authorizing Provider  metFORMIN (GLUCOPHAGE) 500 MG tablet Take 1 tablet (500 mg total) by mouth 2 (two) times daily with a meal. 05/15/22 07/14/22  Alvira Monday, FNP  Olmesartan-amLODIPine-HCTZ 40-5-25 MG TABS Take 1 tablet by mouth once daily 05/09/22   Alvira Monday, FNP  triamcinolone ointment (KENALOG) 0.5 % Apply 1 Application topically 2 (two) times daily. 05/15/22   Alvira Monday, FNP  Vitamin D, Ergocalciferol, (DRISDOL) 1.25 MG (50000 UNIT) CAPS capsule Take 1 capsule (50,000 Units total) by mouth every 7 (seven) days. 05/02/22   Alvira Monday, FNP      Allergies    Clindamycin/lincomycin    Review of Systems   Review of Systems  All other systems reviewed and are negative.   Physical Exam Updated Vital Signs BP (!) 152/78 (BP Location: Left Arm)   Pulse (!) 117   Temp 98.1 F (36.7 C) (Oral)   Resp 20   Ht '5\' 2"'$  (1.575 m)   Wt 108.4 kg   SpO2 100%   BMI 43.71 kg/m  Physical Exam Vitals and nursing note reviewed.  Constitutional:      General: She is not in acute distress.    Appearance: She is well-developed. She is not diaphoretic.   HENT:     Head: Normocephalic and atraumatic.  Cardiovascular:     Rate and Rhythm: Normal rate and regular rhythm.     Heart sounds: No murmur heard.    No friction rub. No gallop.  Pulmonary:     Effort: Pulmonary effort is normal. No respiratory distress.     Breath sounds: Normal breath sounds. No wheezing.  Abdominal:     General: Bowel sounds are normal. There is no distension.     Palpations: Abdomen is soft.     Tenderness: There is no abdominal tenderness.  Musculoskeletal:        General: Normal range of motion.     Cervical back: Normal range of motion and neck supple.  Skin:    General: Skin is warm and dry.     Comments: There is an urticarial-like rash noted to both arms, chest, and upper back.  Neurological:     General: No focal deficit present.     Mental Status: She is alert and oriented to person, place, and time.     ED Results / Procedures / Treatments   Labs (all labs ordered are listed, but only abnormal results are displayed) Labs Reviewed - No data to display  EKG None  Radiology No results found.  Procedures Procedures    Medications Ordered in ED  Medications  predniSONE (DELTASONE) tablet 40 mg (has no administration in time range)  amoxicillin (AMOXIL) capsule 500 mg (has no administration in time range)    ED Course/ Medical Decision Making/ A&P  Patient presenting with a rash that I suspect is related to clindamycin.  This started after taking clindamycin for approximately 2 days.  She is having no trouble breathing and vital signs are stable.  My plan is to change her antibiotic from clindamycin to amoxicillin.  I will also prescribe prednisone and have her follow-up as needed.  Final Clinical Impression(s) / ED Diagnoses Final diagnoses:  None    Rx / DC Orders ED Discharge Orders     None         Veryl Speak, MD 06/09/22 2349

## 2022-08-15 ENCOUNTER — Ambulatory Visit (INDEPENDENT_AMBULATORY_CARE_PROVIDER_SITE_OTHER): Payer: BC Managed Care – PPO | Admitting: Family Medicine

## 2022-08-15 ENCOUNTER — Encounter: Payer: Self-pay | Admitting: Family Medicine

## 2022-08-15 VITALS — BP 140/80 | HR 81 | Ht 61.0 in | Wt 239.1 lb

## 2022-08-15 DIAGNOSIS — L2089 Other atopic dermatitis: Secondary | ICD-10-CM | POA: Diagnosis not present

## 2022-08-15 DIAGNOSIS — E119 Type 2 diabetes mellitus without complications: Secondary | ICD-10-CM | POA: Diagnosis not present

## 2022-08-15 DIAGNOSIS — M67432 Ganglion, left wrist: Secondary | ICD-10-CM | POA: Diagnosis not present

## 2022-08-15 DIAGNOSIS — L308 Other specified dermatitis: Secondary | ICD-10-CM

## 2022-08-15 DIAGNOSIS — I10 Essential (primary) hypertension: Secondary | ICD-10-CM

## 2022-08-15 DIAGNOSIS — Z0001 Encounter for general adult medical examination with abnormal findings: Secondary | ICD-10-CM

## 2022-08-15 DIAGNOSIS — L299 Pruritus, unspecified: Secondary | ICD-10-CM

## 2022-08-15 DIAGNOSIS — Z124 Encounter for screening for malignant neoplasm of cervix: Secondary | ICD-10-CM | POA: Insufficient documentation

## 2022-08-15 MED ORDER — TRIAMCINOLONE ACETONIDE 0.1 % EX CREA: 1.0000 | TOPICAL_CREAM | Freq: Two times a day (BID) | CUTANEOUS | 2 refills | Status: DC

## 2022-08-15 MED ORDER — LEVOCETIRIZINE DIHYDROCHLORIDE 5 MG PO TABS: 5.0000 mg | ORAL_TABLET | Freq: Every evening | ORAL | 0 refills | Status: DC

## 2022-08-15 NOTE — Assessment & Plan Note (Addendum)
A refill of Kenalog 0.1% cream sent to the pharmacy with instructions to apply to the affected site twice daily Instructed to take xyzal 5 mg by mouth every evening to alleviate symptoms of generalized pruritus

## 2022-08-15 NOTE — Assessment & Plan Note (Signed)
Physical exam as documented Counseling is done on healthy lifestyle involving commitment to 150 minutes of exercise per week,  Discussed heart-healthy diet  Patient reports that she is afraid of needles and is not up-to-date with her immunization She reports that she will get her tetanus and shingles vaccination at her next appointment

## 2022-08-15 NOTE — Progress Notes (Signed)
Complete physical exam  Patient: Beverly Mccann   DOB: 1961/04/07   61 y.o. Female  MRN: [id number]  Subjective:    Chief Complaint  Patient presents with   Annual Exam    Pt here for cpe. Due for pap, will come back for this.    Rash    Pt reports rash flare up again wants a cream to help with this. States it has been itching.     Beverly Mccann is a 61 y.o. female who presents today for a complete physical exam. She reports consuming a general diet. The patient does not participate in regular exercise at present. She generally feels well. She reports sleeping well. She does have additional problems to discuss today.   Ganglion cyst of volar aspect of left wrist: She complains of a non tender ganglion cyst of  volar aspect of the left wrist.  Onset of symptoms a year ago.  She reports that the cyst has increased in size since its onset and she would like a referral to orthopedics for further evaluation.  She denies pain, decreased range of motion of the left wrist, and signs of inflammation.  Atopic dermatitis: She would like a refill of her topical Kenalog 0.1% cream.  She notes a pruritic eczematous rash on her lower extremities, abdomen and back.    Most recent fall risk assessment:    08/15/2022   10:53 AM  Fall Risk   Falls in the past year? 0  Number falls in past yr: 0  Injury with Fall? 0  Risk for fall due to : No Fall Risks  Follow up Falls evaluation completed     Most recent depression screenings:    08/15/2022   10:53 AM 08/15/2022   10:49 AM  PHQ 2/9 Scores  PHQ - 2 Score 0 0  PHQ- 9 Score 0 0    Dental: No current dental problems and Last dental visit: 07/2022  Patient Active Problem List   Diagnosis Date Noted   Ganglion cyst of volar aspect of left wrist 08/15/2022   Encounter for annual general medical examination with abnormal findings in adult 08/15/2022   Atopic dermatitis 02/24/2022   BRONCHITIS, ACUTE 10/26/2008   MYALGIA 10/26/2008   ANEMIA,  NORMOCYTIC 10/25/2007   HYPERLIPIDEMIA 08/18/2006   OBESITY NOS 08/18/2006   Essential hypertension 08/18/2006   ECZEMA 08/18/2006   OSTEOARTHRITIS 08/18/2006   HYPERGLYCEMIA 08/18/2006   Past Medical History:  Diagnosis Date   Hypercholesteremia    Hypertension    Past Surgical History:  Procedure Laterality Date   ABDOMINAL HYSTERECTOMY     COLONOSCOPY  02/12/2012   Procedure: COLONOSCOPY;  Surgeon: Danie Binder, MD;  Location: [facility];  Service: Endoscopy;  Laterality: N/A;  10:30 AM   Cyst removed from right breast     Social History   Tobacco Use   Smoking status: Former    Packs/day: 0.50    Years: 15.00    Total pack years: 7.50    Types: Cigarettes    Quit date: 11/12/2011    Years since quitting: 10.7   Smokeless tobacco: Never  Vaping Use   Vaping Use: Never used  Substance Use Topics   Alcohol use: Yes    Comment: occasionally   Drug use: No   Social History   Socioeconomic History   Marital status: Married    Spouse name: Not on file   Number of children: Not on file   Years of education:  Not on file   Highest education level: Not on file  Occupational History   Not on file  Tobacco Use   Smoking status: Former    Packs/day: 0.50    Years: 15.00    Total pack years: 7.50    Types: Cigarettes    Quit date: 11/12/2011    Years since quitting: 10.7   Smokeless tobacco: Never  Vaping Use   Vaping Use: Never used  Substance and Sexual Activity   Alcohol use: Yes    Comment: occasionally   Drug use: No   Sexual activity: Yes    Birth control/protection: Surgical  Other Topics Concern   Not on file  Social History Narrative   Not on file   Social Determinants of Health   Financial Resource Strain: Not on file  Food Insecurity: Not on file  Transportation Needs: Not on file  Physical Activity: Not on file  Stress: Not on file  Social Connections: Not on file  Intimate Partner Violence: Not on file   Family Status  Relation Name  Status   Mother  Deceased   Father  Deceased   Family History  Problem Relation Age of Onset   Hypertension Mother    Diabetes Mother    Hypertension Father    Allergies  Allergen Reactions   Clindamycin/Lincomycin Rash      Patient Care Team: Alvira Monday, FNP as PCP - General (Family Medicine)   Outpatient Medications Prior to Visit  Medication Sig   amoxicillin (AMOXIL) 500 MG capsule Take 1 capsule (500 mg total) by mouth 3 (three) times daily.   Olmesartan-amLODIPine-HCTZ 40-5-25 MG TABS Take 1 tablet by mouth once daily   predniSONE (DELTASONE) 10 MG tablet Take 2 tablets (20 mg total) by mouth 2 (two) times daily.   Vitamin D, Ergocalciferol, (DRISDOL) 1.25 MG (50000 UNIT) CAPS capsule Take 1 capsule (50,000 Units total) by mouth every 7 (seven) days.   [DISCONTINUED] triamcinolone ointment (KENALOG) 0.5 % Apply 1 Application topically 2 (two) times daily.   metFORMIN (GLUCOPHAGE) 500 MG tablet Take 1 tablet (500 mg total) by mouth 2 (two) times daily with a meal.   No facility-administered medications prior to visit.    Review of Systems  Constitutional:  Negative for chills, fever and malaise/fatigue.  HENT:  Negative for congestion and sinus pain.   Eyes:  Negative for pain, discharge and redness.  Respiratory:  Negative for cough, sputum production and shortness of breath.   Cardiovascular:  Negative for chest pain, palpitations, claudication and leg swelling.  Gastrointestinal:  Negative for diarrhea, heartburn and nausea.  Genitourinary:  Negative for flank pain and frequency.  Musculoskeletal:  Negative for back pain and joint pain.       Cyst of the left wrist  Skin:  Positive for itching and rash.  Neurological:  Negative for dizziness, seizures and headaches.  Endo/Heme/Allergies:  Negative for environmental allergies.  Psychiatric/Behavioral:  Negative for memory loss. The patient does not have insomnia.        Objective:    BP (!) 140/80    Pulse 81   Ht _0  (1.549 m)   Wt 239 lb 1.3 oz (108.4 kg)   SpO2 95%   BMI 45.17 kg/m  BP Readings from Last 3 Encounters:  08/15/22 (!) 140/80  06/09/22 (!) 152/78  05/15/22 132/78   Wt Readings from Last 3 Encounters:  08/15/22 239 lb 1.3 oz (108.4 kg)  06/09/22 239 lb (108.4 kg)  05/15/22 239 lb  1.9 oz (108.5 kg)      Physical Exam HENT:     Head: Normocephalic.     Left Ear: External ear normal.     Mouth/Throat:     Mouth: Mucous membranes are moist.  Eyes:     Extraocular Movements: Extraocular movements intact.     Pupils: Pupils are equal, round, and reactive to light.  Cardiovascular:     Rate and Rhythm: Normal rate and regular rhythm.     Heart sounds: No murmur heard. Pulmonary:     Effort: Pulmonary effort is normal.     Breath sounds: Normal breath sounds.  Abdominal:     Palpations: Abdomen is soft.     Tenderness: There is no right CVA tenderness or left CVA tenderness.  Musculoskeletal:     Right lower leg: No edema.     Left lower leg: No edema.     Comments: 3 cm nontender ganglion cyst of the volar aspect of the left wrist palpated with no signs of inflammation or infection noted  Skin:    Findings: Rash (eczematous rash on her lower extremities, abdomen and back) present. No lesion.  Neurological:     Mental Status: She is alert and oriented to person, place, and time.     GCS: GCS eye subscore is 4. GCS verbal subscore is 5. GCS motor subscore is 6.     Cranial Nerves: No facial asymmetry.     Sensory: No sensory deficit.     Motor: No weakness.     Coordination: Coordination normal. Finger-Nose-Finger Test normal.     Gait: Gait normal.  Psychiatric:        Judgment: Judgment normal.     No results found for any visits on 08/15/22. Last CBC Lab Results  Component Value Date   WBC 6.8 02/24/2022   HGB 11.9 02/24/2022   HCT 35.0 02/24/2022   MCV 80 02/24/2022   MCH 27.0 02/24/2022   RDW 13.4 02/24/2022   PLT 226 71/24/5809    Last metabolic panel Lab Results  Component Value Date   GLUCOSE 123 (H) 02/24/2022   NA 142 02/24/2022   K 3.4 (L) 02/24/2022   CL 102 02/24/2022   CO2 22 02/24/2022   BUN 13 02/24/2022   CREATININE 0.66 02/24/2022   EGFR 100 02/24/2022   CALCIUM 9.0 02/24/2022   PROT 7.2 02/24/2022   ALBUMIN 4.4 02/24/2022   LABGLOB 2.8 02/24/2022   AGRATIO 1.6 02/24/2022   BILITOT 0.4 02/24/2022   ALKPHOS 76 02/24/2022   AST 21 02/24/2022   ALT 24 02/24/2022   ANIONGAP 12 08/10/2021   Last lipids Lab Results  Component Value Date   CHOL 209 (H) 02/24/2022   HDL 79 02/24/2022   LDLCALC 117 (H) 02/24/2022   TRIG 75 02/24/2022   CHOLHDL 2.6 02/24/2022   Last hemoglobin A1c Lab Results  Component Value Date   HGBA1C 7.2 (H) 02/24/2022   Last thyroid functions Lab Results  Component Value Date   TSH 1.520 02/24/2022   Last vitamin D Lab Results  Component Value Date   VD25OH 13.9 (L) 02/24/2022   Last vitamin B12 and Folate No results found for: "VITAMINB12", "FOLATE"      Assessment & Plan:    Routine Health Maintenance and Physical Exam  Immunization History  Administered Date(s) Administered   Influenza Whole 07/17/2006, 06/27/2007   Influenza,inj,Quad PF,6+ Mos 06/06/2022   Moderna Sars-Covid-2 Vaccination 11/15/2019, 12/17/2019    Health Maintenance  Topic Date Due  Diabetic kidney evaluation - Urine ACR  Never done   DTaP/Tdap/Td (1 - Tdap) Never done   Zoster Vaccines- Shingrix (1 of 2) Never done   PAP SMEAR-Modifier  10/24/2010   COVID-19 Vaccine (3 - 2023-24 season) 05/05/2022   Diabetic kidney evaluation - eGFR measurement  02/25/2023   MAMMOGRAM  05/25/2024   Fecal DNA (Cologuard)  04/20/2025   INFLUENZA VACCINE  Completed   Hepatitis C Screening  Completed   HIV Screening  Completed   HPV VACCINES  Aged Out   COLONOSCOPY (Pts 45-1yr Insurance coverage will need to be confirmed)  Discontinued    Discussed health benefits of physical  activity, and encouraged her to engage in regular exercise appropriate for her age and condition.  Encounter for annual general medical examination with abnormal findings in adult Assessment & Plan: Physical exam as documented Counseling is done on healthy lifestyle involving commitment to 150 minutes of exercise per week,  Discussed heart-healthy diet  Patient reports that she is afraid of needles and is not up-to-date with her immunization She reports that she will get her tetanus and shingles vaccination at her next appointment      Ganglion cyst of volar aspect of left wrist Assessment & Plan: Referral placed to orthopedic surgery for further evaluation and treatment of ganglion cyst of volar aspect of the wrist  Orders: -     Ambulatory referral to Orthopedic Surgery  Essential hypertension Assessment & Plan: Uncontrolled today She reports missing a dose a day ago, 08/14/2022 She denies headaches, dizziness, blurred vision We will follow-up on BP in 2 weeks Low-sodium diet encouraged with increased physical activities BP Readings from Last 3 Encounters:  08/15/22 (!) 140/80  06/09/22 (!) 152/78  05/15/22 132/78      Other atopic dermatitis Assessment & Plan: A refill of Kenalog 0.1% cream sent to the pharmacy with instructions to apply to the affected site twice daily Instructed to take xyzal 5 mg by mouth every evening to alleviate symptoms of generalized pruritus   Other eczema Assessment & Plan: A refill of Kenalog 0.1% cream sent to the pharmacy with instructions to apply to the affected site twice daily Instructed to take xyzal 5 mg by mouth every evening to alleviate symptoms of generalized pruritus  Orders: -     Triamcinolone Acetonide; Apply 1 Application topically 2 (two) times daily.  Dispense: 15 g; Refill: 2  Pruritus -     Levocetirizine Dihydrochloride; Take 1 tablet (5 mg total) by mouth every evening.  Dispense: 30 tablet; Refill: 0  Type 2  diabetes mellitus without complication, without long-term current use of insulin (HCC) -     Microalbumin / creatinine urine ratio    Return in about 2 weeks (around 08/29/2022) for BP.     GAlvira Monday FNP

## 2022-08-15 NOTE — Assessment & Plan Note (Signed)
Referral placed to orthopedic surgery for further evaluation and treatment of ganglion cyst of volar aspect of the wrist

## 2022-08-15 NOTE — Patient Instructions (Addendum)
I appreciate the opportunity to provide care to you today!    Follow up:  2 weeks BP   Please pick up your medications at the pharmacy   Referrals today- ortho care [place]  Please continue to a heart-healthy diet and increase your physical activities. Try to exercise for 23mns at least three times a week.      It was a pleasure to see you and I look forward to continuing to work together on your health and well-being. Please do not hesitate to call the office if you need care or have questions about your care.   Have a wonderful day and week. With Gratitude, GAlvira MondayMSN, FNP-BC

## 2022-08-15 NOTE — Assessment & Plan Note (Signed)
Uncontrolled today She reports missing a dose a day ago, 08/14/2022 She denies headaches, dizziness, blurred vision We will follow-up on BP in 2 weeks Low-sodium diet encouraged with increased physical activities BP Readings from Last 3 Encounters:  08/15/22 (!) 140/80  06/09/22 (!) 152/78  05/15/22 132/78

## 2022-08-17 LAB — MICROALBUMIN / CREATININE URINE RATIO
Creatinine, Urine: 100.6 mg/dL
Microalb/Creat Ratio: 131 mg/g creat — ABNORMAL HIGH (ref 0–29)
Microalbumin, Urine: 131.9 ug/mL

## 2022-08-18 ENCOUNTER — Other Ambulatory Visit: Payer: Self-pay | Admitting: Family Medicine

## 2022-08-18 DIAGNOSIS — E119 Type 2 diabetes mellitus without complications: Secondary | ICD-10-CM

## 2022-08-18 MED ORDER — DAPAGLIFLOZIN PROPANEDIOL 5 MG PO TABS: 5.0000 mg | ORAL_TABLET | Freq: Every day | ORAL | 2 refills | Status: DC

## 2022-08-18 NOTE — Progress Notes (Signed)
Please inform the patient that a prescription for Farxiga 5 mg daily has been sent to her pharmacy to start taking for her type 2 diabetes and to protect her kidneys

## 2022-08-29 ENCOUNTER — Other Ambulatory Visit: Payer: Self-pay | Admitting: Family Medicine

## 2022-08-29 DIAGNOSIS — E559 Vitamin D deficiency, unspecified: Secondary | ICD-10-CM

## 2022-09-06 ENCOUNTER — Ambulatory Visit: Payer: BC Managed Care – PPO | Admitting: Family Medicine

## 2022-09-11 ENCOUNTER — Ambulatory Visit: Payer: BC Managed Care – PPO | Admitting: Orthopedic Surgery

## 2022-09-20 ENCOUNTER — Encounter: Payer: Self-pay | Admitting: Family Medicine

## 2022-09-20 ENCOUNTER — Ambulatory Visit (INDEPENDENT_AMBULATORY_CARE_PROVIDER_SITE_OTHER): Payer: BC Managed Care – PPO | Admitting: Family Medicine

## 2022-09-20 VITALS — BP 138/80 | HR 83 | Ht 62.0 in | Wt 243.0 lb

## 2022-09-20 DIAGNOSIS — L2089 Other atopic dermatitis: Secondary | ICD-10-CM

## 2022-09-20 DIAGNOSIS — I10 Essential (primary) hypertension: Secondary | ICD-10-CM

## 2022-09-20 DIAGNOSIS — L308 Other specified dermatitis: Secondary | ICD-10-CM

## 2022-09-20 MED ORDER — OLMESARTAN-AMLODIPINE-HCTZ 40-5-25 MG PO TABS: 1.0000 | ORAL_TABLET | Freq: Every day | ORAL | 1 refills | Status: DC

## 2022-09-20 NOTE — Assessment & Plan Note (Signed)
Controlled She takes olmesartan amlodipine hydrochlorothiazide 40-5-25 mg daily Patient is currently asymptomatic Encouraged low-sodium diet and increase physical activities BP Readings from Last 3 Encounters:  09/20/22 138/80  08/15/22 (!) 140/80  06/09/22 (!) 152/78

## 2022-09-20 NOTE — Patient Instructions (Addendum)
I appreciate the opportunity to provide care to you today!    Follow up:  1 months for Pap smear  Please pick up your medication at the pharmacy Please continue low-sodium diet increase physical activities  Physical activity helps: Lower your blood glucose, improve your heart health, lower your blood pressure and cholesterol, burn calories to help manage her weight, gave you energy, lower stress, and improve his sleep.  The American diabetes Association (ADA) recommends being active for 2-1/2 hours (150 minutes) or more week.  Exercise for 30 minutes, 5 days a week (150 minutes total)    Please continue to a heart-healthy diet and increase your physical activities. Try to exercise for 4mns at least five times a week.      It was a pleasure to see you and I look forward to continuing to work together on your health and well-being. Please do not hesitate to call the office if you need care or have questions about your care.   Have a wonderful day and week. With Gratitude, GAlvira MondayMSN, FNP-BC

## 2022-09-20 NOTE — Assessment & Plan Note (Addendum)
Inform the patient that she has 2 refills on her Kenalog cream to pick up at the pharmacy

## 2022-09-20 NOTE — Progress Notes (Signed)
Established Patient Office Visit  Subjective:  Patient ID: Beverly Mccann, female    DOB: Jan 13, 1961  Age: 62 y.o. MRN: [id number]  CC:  Chief Complaint  Patient presents with   Follow-up    2 week bp, pt reports knee itchiness on her right knee, needs a refill.     HPI Beverly Mccann is a 62 y.o. female with past medical history of primary hypertension, and atopic dermatitis presents for f/u of  chronic medical conditions. For the details of today's visit, please refer to the assessment and plan.     Past Medical History:  Diagnosis Date   Hypercholesteremia    Hypertension     Past Surgical History:  Procedure Laterality Date   ABDOMINAL HYSTERECTOMY     COLONOSCOPY  02/12/2012   Procedure: COLONOSCOPY;  Surgeon: Danie Binder, MD;  Location: [facility];  Service: Endoscopy;  Laterality: N/A;  10:30 AM   Cyst removed from right breast      Family History  Problem Relation Age of Onset   Hypertension Mother    Diabetes Mother    Hypertension Father     Social History   Socioeconomic History   Marital status: Married    Spouse name: Not on file   Number of children: Not on file   Years of education: Not on file   Highest education level: Not on file  Occupational History   Not on file  Tobacco Use   Smoking status: Former    Packs/day: 0.50    Years: 15.00    Total pack years: 7.50    Types: Cigarettes    Quit date: 11/12/2011    Years since quitting: 10.8   Smokeless tobacco: Never  Vaping Use   Vaping Use: Never used  Substance and Sexual Activity   Alcohol use: Yes    Comment: occasionally   Drug use: No   Sexual activity: Yes    Birth control/protection: Surgical  Other Topics Concern   Not on file  Social History Narrative   Not on file   Social Determinants of Health   Financial Resource Strain: Not on file  Food Insecurity: Not on file  Transportation Needs: Not on file  Physical Activity: Not on file  Stress: Not on file  Social  Connections: Not on file  Intimate Partner Violence: Not on file    Outpatient Medications Prior to Visit  Medication Sig Dispense Refill   dapagliflozin propanediol (FARXIGA) 5 MG TABS tablet Take 1 tablet (5 mg total) by mouth daily before breakfast. 30 tablet 2   levocetirizine (XYZAL) 5 MG tablet Take 1 tablet (5 mg total) by mouth every evening. 30 tablet 0   Vitamin D, Ergocalciferol, (DRISDOL) 1.25 MG (50000 UNIT) CAPS capsule Take 1 capsule by mouth once a week 5 capsule 0   amoxicillin (AMOXIL) 500 MG capsule Take 1 capsule (500 mg total) by mouth 3 (three) times daily. 21 capsule 0   Olmesartan-amLODIPine-HCTZ 40-5-25 MG TABS Take 1 tablet by mouth once daily 30 tablet 4   predniSONE (DELTASONE) 10 MG tablet Take 2 tablets (20 mg total) by mouth 2 (two) times daily. 20 tablet 0   metFORMIN (GLUCOPHAGE) 500 MG tablet Take 1 tablet (500 mg total) by mouth 2 (two) times daily with a meal. 60 tablet 1   triamcinolone cream (KENALOG) 0.1 % Apply 1 Application topically 2 (two) times daily. (Patient not taking: Reported on 09/20/2022) 15 g 2   No facility-administered medications  prior to visit.    Allergies  Allergen Reactions   Clindamycin/Lincomycin Rash    ROS Review of Systems  Constitutional:  Negative for chills and fever.  Eyes:  Negative for visual disturbance.  Respiratory:  Negative for chest tightness and shortness of breath.   Skin:  Positive for rash.  Neurological:  Negative for dizziness and headaches.      Objective:    Physical Exam HENT:     Head: Normocephalic.     Mouth/Throat:     Mouth: Mucous membranes are moist.  Cardiovascular:     Rate and Rhythm: Normal rate.     Heart sounds: Normal heart sounds.  Pulmonary:     Effort: Pulmonary effort is normal.     Breath sounds: Normal breath sounds.  Skin:    Findings: Rash (eczematous rash noted on the right knee) present.  Neurological:     Mental Status: She is alert.     BP 138/80 (BP  Location: Left Arm)   Pulse 83   Ht '5\' 2"'$  (1.575 m)   Wt 243 lb (110.2 kg)   SpO2 (!) 83%   BMI 44.45 kg/m  Wt Readings from Last 3 Encounters:  09/20/22 243 lb (110.2 kg)  08/15/22 239 lb 1.3 oz (108.4 kg)  06/09/22 239 lb (108.4 kg)    Lab Results  Component Value Date   TSH 1.520 02/24/2022   Lab Results  Component Value Date   WBC 6.8 02/24/2022   HGB 11.9 02/24/2022   HCT 35.0 02/24/2022   MCV 80 02/24/2022   PLT 226 02/24/2022   Lab Results  Component Value Date   NA 142 02/24/2022   K 3.4 (L) 02/24/2022   CO2 22 02/24/2022   GLUCOSE 123 (H) 02/24/2022   BUN 13 02/24/2022   CREATININE 0.66 02/24/2022   BILITOT 0.4 02/24/2022   ALKPHOS 76 02/24/2022   AST 21 02/24/2022   ALT 24 02/24/2022   PROT 7.2 02/24/2022   ALBUMIN 4.4 02/24/2022   CALCIUM 9.0 02/24/2022   ANIONGAP 12 08/10/2021   EGFR 100 02/24/2022   Lab Results  Component Value Date   CHOL 209 (H) 02/24/2022   Lab Results  Component Value Date   HDL 79 02/24/2022   Lab Results  Component Value Date   LDLCALC 117 (H) 02/24/2022   Lab Results  Component Value Date   TRIG 75 02/24/2022   Lab Results  Component Value Date   CHOLHDL 2.6 02/24/2022   Lab Results  Component Value Date   HGBA1C 7.2 (H) 02/24/2022      Assessment & Plan:  Essential hypertension Assessment & Plan: Controlled She takes olmesartan amlodipine hydrochlorothiazide 40-5-25 mg daily Patient is currently asymptomatic Encouraged low-sodium diet and increase physical activities BP Readings from Last 3 Encounters:  09/20/22 138/80  08/15/22 (!) 140/80  06/09/22 (!) 152/78     Orders: -     Olmesartan-amLODIPine-HCTZ; Take 1 tablet by mouth daily.  Dispense: 90 tablet; Refill: 1  Flexural atopic dermatitis Assessment & Plan: Inform the patient that she has 2 refills on her Kenalog cream to pick up at the pharmacy   Other eczema    Follow-up: Return in about 1 month (around 10/21/2022) for pap smear.    Beverly Monday, FNP

## 2022-09-25 ENCOUNTER — Other Ambulatory Visit: Payer: Self-pay | Admitting: Family Medicine

## 2022-09-25 DIAGNOSIS — E559 Vitamin D deficiency, unspecified: Secondary | ICD-10-CM

## 2022-09-25 MED ORDER — VITAMIN D (ERGOCALCIFEROL) 1.25 MG (50000 UNIT) PO CAPS: 50000.0000 [IU] | ORAL_CAPSULE | ORAL | 0 refills | Status: DC

## 2022-09-25 NOTE — Telephone Encounter (Signed)
Rx sent 

## 2022-10-25 ENCOUNTER — Encounter: Payer: Self-pay | Admitting: Family Medicine

## 2022-10-25 ENCOUNTER — Ambulatory Visit: Payer: BC Managed Care – PPO | Admitting: Family Medicine

## 2022-11-09 ENCOUNTER — Ambulatory Visit (INDEPENDENT_AMBULATORY_CARE_PROVIDER_SITE_OTHER): Payer: BC Managed Care – PPO | Admitting: Family Medicine

## 2022-11-09 ENCOUNTER — Encounter: Payer: Self-pay | Admitting: Family Medicine

## 2022-11-09 ENCOUNTER — Other Ambulatory Visit (HOSPITAL_COMMUNITY)
Admission: RE | Admit: 2022-11-09 | Discharge: 2022-11-09 | Disposition: A | Discharge status: Home / Self Care | Payer: BC Managed Care – PPO | Source: Ambulatory Visit | Attending: Family Medicine | Admitting: Family Medicine

## 2022-11-09 VITALS — BP 158/91 | HR 96 | Ht 62.0 in | Wt 244.0 lb

## 2022-11-09 DIAGNOSIS — Z124 Encounter for screening for malignant neoplasm of cervix: Secondary | ICD-10-CM | POA: Diagnosis not present

## 2022-11-09 DIAGNOSIS — Z01419 Encounter for gynecological examination (general) (routine) without abnormal findings: Secondary | ICD-10-CM | POA: Diagnosis not present

## 2022-11-09 NOTE — Patient Instructions (Addendum)
I appreciate the opportunity to provide care to you today!    Follow up:  4 months  You will be contacted with the results of your Pap smear examination today   Please continue to a heart-healthy diet and increase your physical activities. Try to exercise for 57mns at least five times a week.      It was a pleasure to see you and I look forward to continuing to work together on your health and well-being. Please do not hesitate to call the office if you need care or have questions about your care.   Have a wonderful day and week. With Gratitude, GAlvira MondayMSN, FNP-BC

## 2022-11-09 NOTE — Progress Notes (Signed)
Established Patient Office Visit  Subjective:  Patient ID: Beverly Mccann, female    DOB: [birth year]  Age: 62 y.o. MRN: [id number]  CC:  Chief Complaint  Patient presents with   Annual Exam    HPI Beverly Mccann is a 62 y.o. female with past medical history of essential hypertension, and eczema presents for Pap smear examination.  Past Medical History:  Diagnosis Date   Hypercholesteremia    Hypertension     Past Surgical History:  Procedure Laterality Date   ABDOMINAL HYSTERECTOMY     COLONOSCOPY  02/12/2012   Procedure: COLONOSCOPY;  Surgeon: Danie Binder, MD;  Location: [facility];  Service: Endoscopy;  Laterality: N/A;  10:30 AM   Cyst removed from right breast      Family History  Problem Relation Age of Onset   Hypertension Mother    Diabetes Mother    Hypertension Father     Social History   Socioeconomic History   Marital status: Married    Spouse name: Not on file   Number of children: Not on file   Years of education: Not on file   Highest education level: Not on file  Occupational History   Not on file  Tobacco Use   Smoking status: Former    Packs/day: 0.50    Years: 15.00    Total pack years: 7.50    Types: Cigarettes    Quit date: 11/12/2011    Years since quitting: 11.0   Smokeless tobacco: Never  Vaping Use   Vaping Use: Never used  Substance and Sexual Activity   Alcohol use: Yes    Comment: occasionally   Drug use: No   Sexual activity: Yes    Birth control/protection: Surgical  Other Topics Concern   Not on file  Social History Narrative   Not on file   Social Determinants of Health   Financial Resource Strain: Not on file  Food Insecurity: Not on file  Transportation Needs: Not on file  Physical Activity: Not on file  Stress: Not on file  Social Connections: Not on file  Intimate Partner Violence: Not on file    Outpatient Medications Prior to Visit  Medication Sig Dispense Refill   dapagliflozin propanediol  (FARXIGA) 5 MG TABS tablet Take 1 tablet (5 mg total) by mouth daily before breakfast. 30 tablet 2   levocetirizine (XYZAL) 5 MG tablet Take 1 tablet (5 mg total) by mouth every evening. 30 tablet 0   Olmesartan-amLODIPine-HCTZ 40-5-25 MG TABS Take 1 tablet by mouth daily. 90 tablet 1   triamcinolone cream (KENALOG) 0.1 % Apply 1 Application topically 2 (two) times daily. 15 g 2   Vitamin D, Ergocalciferol, (DRISDOL) 1.25 MG (50000 UNIT) CAPS capsule Take 1 capsule (50,000 Units total) by mouth once a week. 10 capsule 0   metFORMIN (GLUCOPHAGE) 500 MG tablet Take 1 tablet (500 mg total) by mouth 2 (two) times daily with a meal. 60 tablet 1   No facility-administered medications prior to visit.    Allergies  Allergen Reactions   Clindamycin/Lincomycin Rash    ROS Review of Systems  Constitutional:  Negative for chills and fever.  Eyes:  Negative for visual disturbance.  Respiratory:  Negative for chest tightness and shortness of breath.   Genitourinary:  Negative for decreased urine volume, pelvic pain, vaginal bleeding and vaginal discharge.       History of a partial hysterectomy  Neurological:  Negative for dizziness and headaches.  Objective:    Physical Exam HENT:     Head: Normocephalic.     Mouth/Throat:     Mouth: Mucous membranes are moist.  Cardiovascular:     Rate and Rhythm: Normal rate.     Heart sounds: Normal heart sounds.  Pulmonary:     Effort: Pulmonary effort is normal.     Breath sounds: Normal breath sounds.  Genitourinary:    Exam position: Lithotomy position.     Comments: Vaginal wall: pink and rugated, smooth and non-tender; absence of lesions, edema, and erythema. Labia Majora and Minora: present bilaterally, moist, soft tissue, and homogeneous; free of edema and ulcerations. Clitoris is anatomically present, above the urethral, and free of lesions, masses, and ulceration.    Neurological:     Mental Status: She is alert.     BP (!) 158/91    Pulse 96   Ht '5\' 2"'$  (1.575 m)   Wt 244 lb (110.7 kg)   SpO2 96%   BMI 44.63 kg/m  Wt Readings from Last 3 Encounters:  11/09/22 244 lb (110.7 kg)  09/20/22 243 lb (110.2 kg)  08/15/22 239 lb 1.3 oz (108.4 kg)    Lab Results  Component Value Date   TSH 1.520 02/24/2022   Lab Results  Component Value Date   WBC 6.8 02/24/2022   HGB 11.9 02/24/2022   HCT 35.0 02/24/2022   MCV 80 02/24/2022   PLT 226 02/24/2022   Lab Results  Component Value Date   NA 142 02/24/2022   K 3.4 (L) 02/24/2022   CO2 22 02/24/2022   GLUCOSE 123 (H) 02/24/2022   BUN 13 02/24/2022   CREATININE 0.66 02/24/2022   BILITOT 0.4 02/24/2022   ALKPHOS 76 02/24/2022   AST 21 02/24/2022   ALT 24 02/24/2022   PROT 7.2 02/24/2022   ALBUMIN 4.4 02/24/2022   CALCIUM 9.0 02/24/2022   ANIONGAP 12 08/10/2021   EGFR 100 02/24/2022   Lab Results  Component Value Date   CHOL 209 (H) 02/24/2022   Lab Results  Component Value Date   HDL 79 02/24/2022   Lab Results  Component Value Date   LDLCALC 117 (H) 02/24/2022   Lab Results  Component Value Date   TRIG 75 02/24/2022   Lab Results  Component Value Date   CHOLHDL 2.6 02/24/2022   Lab Results  Component Value Date   HGBA1C 7.2 (H) 02/24/2022      Assessment & Plan:  Pap smear for cervical cancer screening Assessment & Plan: -Cytology and HPV co-testing (preferred) every 5 years or cytology alone (acceptable) every 3 years. - Pap due 2029    Orders: -     Cytology - PAP    Follow-up: Return in about 4 months (around 03/11/2023).   Beverly Monday, FNP

## 2022-11-09 NOTE — Assessment & Plan Note (Signed)
-  Cytology and HPV co-testing (preferred) every 5 years or cytology alone (acceptable) every 3 years. - Pap due 2029

## 2022-11-12 ENCOUNTER — Other Ambulatory Visit: Payer: Self-pay | Admitting: Family Medicine

## 2022-11-12 DIAGNOSIS — E119 Type 2 diabetes mellitus without complications: Secondary | ICD-10-CM

## 2022-11-13 LAB — CYTOLOGY - PAP
Adequacy: ABSENT
Comment: NEGATIVE
Diagnosis: NEGATIVE
High risk HPV: NEGATIVE

## 2022-11-13 NOTE — Progress Notes (Signed)
Please inform the patient that her pap smear was negative for abnormal growth or malignancy on her cervix.

## 2022-11-22 ENCOUNTER — Ambulatory Visit
Admission: EM | Admit: 2022-11-22 | Discharge: 2022-11-22 | Disposition: A | Discharge status: Home / Self Care | Payer: BC Managed Care – PPO

## 2022-11-22 ENCOUNTER — Ambulatory Visit (INDEPENDENT_AMBULATORY_CARE_PROVIDER_SITE_OTHER): Payer: BC Managed Care – PPO

## 2022-11-22 DIAGNOSIS — M25561 Pain in right knee: Secondary | ICD-10-CM

## 2022-11-22 DIAGNOSIS — M1711 Unilateral primary osteoarthritis, right knee: Secondary | ICD-10-CM

## 2022-11-22 MED ORDER — DICLOFENAC SODIUM 1 % EX GEL: 4.0000 g | Freq: Four times a day (QID) | CUTANEOUS | 0 refills | Status: DC

## 2022-11-22 MED ORDER — PREDNISONE 50 MG PO TABS: ORAL_TABLET | ORAL | 0 refills | Status: DC

## 2022-11-22 NOTE — Discharge Instructions (Addendum)
Your x-rays are negative for fracture or dislocation.  Symptoms are being caused by osteoarthritis. May take over-the-counter Tylenol Arthritis Strength 650mg  tablets as needed for pain. RICE therapy rest, ice, compression, and elevation until your symptoms improve.  Apply ice for 20 minutes, remove for 1 hour, then repeat as often as possible.  This will help with pain and swelling. Wear the Ace wrap with strenuous or prolonged activity. As discussed, if symptoms continue to worsen, recommend follow-up with your primary care physician to discuss referral to orthopedics or medication to treat your symptoms. Follow-up as needed.

## 2022-11-22 NOTE — ED Provider Notes (Signed)
[facility]    CSN: [id number] Arrival date & time: 11/22/22  1252      History   Chief Complaint Chief Complaint  Patient presents with   Knee Pain    HPI Beverly Mccann is a 62 y.o. female.   The history is provided by the patient.   The patient presents for complaints of right knee pain that has been persistent for the last several years.  She states over the last 6 months, she has had intermittent pain.  Pain is located on the inside of her knee.  She states that the pain worsens when she walks for long periods of time, and with certain movement.  Patient denies injury or trauma, numbness, tingling, radiation of pain, or weakness of the right lower extremity.  She states that she does have swelling of the knee from time to time.  She reports she has been taking Tylenol for her knee pain with no relief.   Patient Active Problem List   Diagnosis Date Noted   Ganglion cyst of volar aspect of left wrist 08/15/2022   Pap smear for cervical cancer screening 08/15/2022   Atopic dermatitis 02/24/2022   BRONCHITIS, ACUTE 10/26/2008   MYALGIA 10/26/2008   ANEMIA, NORMOCYTIC 10/25/2007   HYPERLIPIDEMIA 08/18/2006   OBESITY NOS 08/18/2006   Essential hypertension 08/18/2006   ECZEMA 08/18/2006   OSTEOARTHRITIS 08/18/2006   HYPERGLYCEMIA 08/18/2006    Past Surgical History:  Procedure Laterality Date   ABDOMINAL HYSTERECTOMY     COLONOSCOPY  02/12/2012   Procedure: COLONOSCOPY;  Surgeon: Danie Binder, MD;  Location: [facility];  Service: Endoscopy;  Laterality: N/A;  10:30 AM   Cyst removed from right breast      OB History     Gravida  3   Para  3   Term  3   Preterm      AB      Living  3      SAB      IAB      Ectopic      Multiple      Live Births               Home Medications    Prior to Admission medications   Medication Sig Start Date End Date Taking? Authorizing Provider  acetaminophen (TYLENOL) 500 MG tablet Take 500  mg by mouth every 6 (six) hours as needed.   Yes [provider]  diclofenac Sodium (VOLTAREN) 1 % GEL Apply 4 g topically 4 (four) times daily. 11/22/22  Yes Tersea Aulds-Warren, Alda Starr, NP  predniSONE (DELTASONE) 50 MG tablet Take one tablet with breakfast for the next 5 days. 11/22/22  Yes Colson Barco-Warren, Alda Marte, NP  dapagliflozin propanediol (FARXIGA) 5 MG TABS tablet TAKE 1 TABLET BY MOUTH ONCE DAILY BEFORE BREAKFAST 11/13/22   Alvira Monday, FNP  levocetirizine (XYZAL) 5 MG tablet Take 1 tablet (5 mg total) by mouth every evening. 08/15/22   Alvira Monday, FNP  metFORMIN (GLUCOPHAGE) 500 MG tablet Take 1 tablet (500 mg total) by mouth 2 (two) times daily with a meal. 05/15/22 07/14/22  Alvira Monday, FNP  Olmesartan-amLODIPine-HCTZ 40-5-25 MG TABS Take 1 tablet by mouth daily. 09/20/22   Alvira Monday, FNP  triamcinolone cream (KENALOG) 0.1 % Apply 1 Application topically 2 (two) times daily. 08/15/22   Alvira Monday, FNP  Vitamin D, Ergocalciferol, (DRISDOL) 1.25 MG (50000 UNIT) CAPS capsule Take 1 capsule (50,000 Units total) by mouth once a week.  09/25/22   Alvira Monday, FNP    Family History Family History  Problem Relation Age of Onset   Hypertension Mother    Diabetes Mother    Hypertension Father     Social History Social History   Tobacco Use   Smoking status: Former    Packs/day: 0.50    Years: 15.00    Additional pack years: 0.00    Total pack years: 7.50    Types: Cigarettes    Quit date: 11/12/2011    Years since quitting: 11.0   Smokeless tobacco: Never  Vaping Use   Vaping Use: Never used  Substance Use Topics   Alcohol use: Yes    Comment: occasionally   Drug use: No     Allergies   Clindamycin/lincomycin   Review of Systems Review of Systems Per HPI  Physical Exam Triage Vital Signs ED Triage Vitals  Enc Vitals Group     BP 11/22/22 1405 (!) 157/90     Pulse Rate 11/22/22 1405 85     Resp 11/22/22 1405 18     Temp 11/22/22  1405 97.9 F (36.6 C)     Temp Source 11/22/22 1405 Oral     SpO2 11/22/22 1405 96 %     Weight --      Height --      Head Circumference --      Peak Flow --      Pain Score 11/22/22 1409 8     Pain Loc --      Pain Edu? --      Excl. in [place]? --    No data found.  Updated Vital Signs BP (!) 157/90 (BP Location: Right Arm)   Pulse 85   Temp 97.9 F (36.6 C) (Oral)   Resp 18   SpO2 96%   Visual Acuity Right Eye Distance:   Left Eye Distance:   Bilateral Distance:    Right Eye Near:   Left Eye Near:    Bilateral Near:     Physical Exam Vitals and nursing note reviewed.  Constitutional:      Appearance: Normal appearance. She is not ill-appearing.  Eyes:     Extraocular Movements: Extraocular movements intact.     Pupils: Pupils are equal, round, and reactive to light.  Musculoskeletal:     Right knee: No effusion or erythema. Decreased range of motion. Tenderness present over the medial joint line. Normal pulse.  Neurological:     General: No focal deficit present.     Mental Status: She is alert and oriented to person, place, and time.  Psychiatric:        Mood and Affect: Mood normal.        Behavior: Behavior normal.      UC Treatments / Results  Labs (all labs ordered are listed, but only abnormal results are displayed) Labs Reviewed - No data to display  EKG   Radiology DG Knee Complete 4 Views Right  Result Date: 11/22/2022 CLINICAL DATA:  Right knee pain for 6 months EXAM: RIGHT KNEE - COMPLETE 4+ VIEW COMPARISON:  None Available. FINDINGS: No acute fracture. Moderate tricompartmental osteoarthritis of the right knee, most severe within the medial compartment. Small knee joint effusion, nonspecific. No focal soft tissue abnormality. IMPRESSION: Moderate tricompartmental osteoarthritis of the right knee, most severe within the medial compartment. Electronically Signed   By: Davina Poke D.O.   On: 11/22/2022 14:36    Procedures Procedures  (including critical care time)  Medications  Ordered in UC Medications - No data to display  Initial Impression / Assessment and Plan / UC Course  I have reviewed the triage vital signs and the nursing notes.  Pertinent labs & imaging results that were available during my care of the patient were reviewed by me and considered in my medical decision making (see chart for details).  The patient is well-appearing, she is in no acute distress, vital signs are stable.  Patient with a history of chronic right knee pain.  She does have a history of osteoarthritis.  X-ray of the right knee was performed, which showed moderate tricompartmental osteoarthritis.  Patient is prescribed prednisone 50 mg for the next 5 days to help with inflammation and swelling, and diclofenac 1% gel for topical application to treat pain.  Patient was advised that she can take arthritis strength Tylenol 650 mg tablets as needed for pain with the prednisone.  Strap was provided for compression and support of the right knee.  Supportive care recommendations to include RICE therapy were provided and discussed with the patient.  Patient advised to follow-up with her PCP if symptoms continue to persist to discuss referral to orthopedics.  Patient is in agreement with this plan of care and verbalizes understanding.  All questions were answered.  Patient stable for discharge. Final Clinical Impressions(s) / UC Diagnoses   Final diagnoses:  Osteoarthritis of right knee, unspecified osteoarthritis type     Discharge Instructions      Your x-rays are negative for fracture or dislocation.  Symptoms are being caused by osteoarthritis. May take over-the-counter Tylenol Arthritis Strength 650mg  tablets as needed for pain. RICE therapy rest, ice, compression, and elevation until your symptoms improve.  Apply ice for 20 minutes, remove for 1 hour, then repeat as often as possible.  This will help with pain and swelling. Wear the Ace wrap  with strenuous or prolonged activity. As discussed, if symptoms continue to worsen, recommend follow-up with your primary care physician to discuss referral to orthopedics or medication to treat your symptoms. Follow-up as needed.      ED Prescriptions     Medication Sig Dispense Auth. Provider   predniSONE (DELTASONE) 50 MG tablet Take one tablet with breakfast for the next 5 days. 5 tablet Vanilla Heatherington-Warren, Alda Centola, NP   diclofenac Sodium (VOLTAREN) 1 % GEL Apply 4 g topically 4 (four) times daily. 150 g Rip Hawes-Warren, Alda Lipke, NP      PDMP not reviewed this encounter.   Tish Men, NP 11/22/22 1455

## 2022-11-22 NOTE — ED Triage Notes (Signed)
Pt reports on and off right knee pain x 6 months. Tylenol gives no relief. Pain is worse when "moves certain ways" and sleeping.

## 2022-12-01 ENCOUNTER — Other Ambulatory Visit: Payer: Self-pay | Admitting: Family Medicine

## 2022-12-01 DIAGNOSIS — E559 Vitamin D deficiency, unspecified: Secondary | ICD-10-CM

## 2022-12-05 ENCOUNTER — Other Ambulatory Visit: Payer: Self-pay | Admitting: Family Medicine

## 2022-12-05 DIAGNOSIS — L308 Other specified dermatitis: Secondary | ICD-10-CM

## 2022-12-05 MED ORDER — TRIAMCINOLONE ACETONIDE 0.1 % EX CREA: 1.0000 | TOPICAL_CREAM | Freq: Two times a day (BID) | CUTANEOUS | 1 refills | Status: DC

## 2022-12-13 ENCOUNTER — Other Ambulatory Visit: Payer: Self-pay | Admitting: Family Medicine

## 2022-12-13 DIAGNOSIS — E119 Type 2 diabetes mellitus without complications: Secondary | ICD-10-CM

## 2022-12-20 ENCOUNTER — Ambulatory Visit (INDEPENDENT_AMBULATORY_CARE_PROVIDER_SITE_OTHER): Payer: BC Managed Care – PPO | Admitting: Family Medicine

## 2022-12-20 ENCOUNTER — Encounter: Payer: Self-pay | Admitting: Family Medicine

## 2022-12-20 VITALS — BP 147/80 | HR 94 | Ht 62.0 in | Wt 232.1 lb

## 2022-12-20 DIAGNOSIS — L299 Pruritus, unspecified: Secondary | ICD-10-CM | POA: Diagnosis not present

## 2022-12-20 DIAGNOSIS — Z716 Tobacco abuse counseling: Secondary | ICD-10-CM | POA: Diagnosis not present

## 2022-12-20 DIAGNOSIS — K098 Other cysts of oral region, not elsewhere classified: Secondary | ICD-10-CM | POA: Diagnosis not present

## 2022-12-20 MED ORDER — NICOTINE 21 MG/24HR TD PT24: 21.0000 mg | MEDICATED_PATCH | Freq: Every day | TRANSDERMAL | 0 refills | Status: AC

## 2022-12-20 MED ORDER — LEVOCETIRIZINE DIHYDROCHLORIDE 5 MG PO TABS: 5.0000 mg | ORAL_TABLET | Freq: Every evening | ORAL | 0 refills | Status: AC

## 2022-12-20 NOTE — Patient Instructions (Signed)
It was pleasure meeting with you today. Please take medications as prescribed. Follow up with your primary health provider if any health concerns arises.  

## 2022-12-20 NOTE — Assessment & Plan Note (Addendum)
Advise patient dental hygiene care and smoking cessation. Discussed Rinsing mouth with warm saltwater solution for pain relief. Referral placed to dermatology

## 2022-12-20 NOTE — Assessment & Plan Note (Signed)
Started Nicotine patch 21 mg as initial therapy, Advise to list smoking trigger and how to avoid them.  Follow up in 6 weeks.

## 2022-12-20 NOTE — Progress Notes (Signed)
Patient Office Visit   Subjective   Patient ID: Beverly Mccann, female    DOB: 02/17/1961  Age: 62 y.o. MRN: [id number]  CC:  Chief Complaint  Patient presents with   Cyst    Noticed cyst in mouth 12/15/22.    Rash    On both knees, tried some cream but that cream isn't working and taking benadryl. Started 12/13/2022.    HPI Beverly Mccann 62 year old female, presents to the clinic for mouth cyst starting on 12/13/22.She  has a past medical history of Hypercholesteremia and Hypertension.For the details of today's visit, please refer to assessment and plan.    Outpatient Encounter Medications as of 12/20/2022  Medication Sig   acetaminophen (TYLENOL) 500 MG tablet Take 500 mg by mouth every 6 (six) hours as needed.   diclofenac Sodium (VOLTAREN) 1 % GEL Apply 4 g topically 4 (four) times daily.   FARXIGA 5 MG TABS tablet TAKE 1 TABLET BY MOUTH ONCE DAILY BEFORE BREAKFAST   metFORMIN (GLUCOPHAGE) 500 MG tablet Take 1 tablet (500 mg total) by mouth 2 (two) times daily with a meal.   nicotine (NICODERM CQ) 21 mg/24hr patch Place 1 patch (21 mg total) onto the skin daily.   Olmesartan-amLODIPine-HCTZ 40-5-25 MG TABS Take 1 tablet by mouth daily.   predniSONE (DELTASONE) 50 MG tablet Take one tablet with breakfast for the next 5 days.   triamcinolone cream (KENALOG) 0.1 % Apply 1 Application topically 2 (two) times daily.   Vitamin D, Ergocalciferol, (DRISDOL) 1.25 MG (50000 UNIT) CAPS capsule Take 1 capsule by mouth once a week   [DISCONTINUED] levocetirizine (XYZAL) 5 MG tablet Take 1 tablet (5 mg total) by mouth every evening.   levocetirizine (XYZAL) 5 MG tablet Take 1 tablet (5 mg total) by mouth every evening.   No facility-administered encounter medications on file as of 12/20/2022.    Past Surgical History:  Procedure Laterality Date   ABDOMINAL HYSTERECTOMY     COLONOSCOPY  02/12/2012   Procedure: COLONOSCOPY;  Surgeon: West Bali, MD;  Location: [facility];  Service:  Endoscopy;  Laterality: N/A;  10:30 AM   Cyst removed from right breast      Review of Systems  Constitutional:  Negative for chills and fever.  HENT:  Negative for sinus pain and sore throat.   Eyes:  Negative for blurred vision.  Respiratory:  Negative for cough.   Cardiovascular:  Negative for chest pain.  Genitourinary:  Negative for dysuria.  Musculoskeletal:  Negative for myalgias.  Skin:  Negative for rash.  Neurological:  Negative for dizziness and headaches.  Psychiatric/Behavioral:  Negative for depression.       Objective    BP (!) 147/80   Pulse 94   Ht  (1.575 m)   Wt 232 lb 1.9 oz (105.3 kg)   SpO2 95%   BMI 42.46 kg/m   Physical Exam Vitals reviewed.  Constitutional:      General: She is not in acute distress.    Appearance: Normal appearance. She is not ill-appearing, toxic-appearing or diaphoretic.  HENT:     Head: Normocephalic.     Mouth/Throat:     Mouth: Mucous membranes are moist. No injury.     Tongue: No lesions.     Pharynx: Oropharynx is clear. Uvula midline.     Comments: Left small Circular cyst non- erythema, no drainage or pus noted Eyes:     General:  Right eye: No discharge.        Left eye: No discharge.     Conjunctiva/sclera: Conjunctivae normal.  Cardiovascular:     Rate and Rhythm: Normal rate.     Pulses: Normal pulses.     Heart sounds: Normal heart sounds.  Pulmonary:     Effort: Pulmonary effort is normal. No respiratory distress.     Breath sounds: Normal breath sounds.  Musculoskeletal:        General: Normal range of motion.     Cervical back: Normal range of motion.  Skin:    General: Skin is warm and dry.     Capillary Refill: Capillary refill takes less than 2 seconds.  Neurological:     General: No focal deficit present.     Mental Status: She is alert and oriented to person, place, and time.     Coordination: Coordination normal.     Gait: Gait normal.  Psychiatric:        Mood and Affect: Mood  normal.        Behavior: Behavior normal.        Thought Content: Thought content normal.        Judgment: Judgment normal.       Assessment & Plan:  Cyst of mouth Assessment & Plan: Advise patient dental hygiene care and smoking cessation. Discussed Rinsing mouth with warm saltwater solution for pain relief. Referral placed to dermatology   Orders: -     Ambulatory referral to Dermatology  Encounter for smoking cessation counseling Assessment & Plan: Started Nicotine patch 21 mg as initial therapy, Advise to list smoking trigger and how to avoid them.  Follow up in 6 weeks.  Orders: -     Nicotine; Place 1 patch (21 mg total) onto the skin daily.  Dispense: 28 patch; Refill: 0  Pruritus -     Levocetirizine Dihydrochloride; Take 1 tablet (5 mg total) by mouth every evening.  Dispense: 30 tablet; Refill: 0    Return in about 6 weeks (around 01/31/2023) for smoking cessation .   Cruzita Lederer Newman Nip, FNP

## 2022-12-28 ENCOUNTER — Encounter (HOSPITAL_COMMUNITY): Payer: Self-pay | Admitting: Emergency Medicine

## 2022-12-28 ENCOUNTER — Other Ambulatory Visit: Payer: Self-pay

## 2022-12-28 ENCOUNTER — Emergency Department (HOSPITAL_COMMUNITY)
Admission: EM | Admit: 2022-12-28 | Discharge: 2022-12-28 | Disposition: A | Discharge status: Home / Self Care | Payer: BC Managed Care – PPO | Attending: Emergency Medicine | Admitting: Emergency Medicine

## 2022-12-28 DIAGNOSIS — L309 Dermatitis, unspecified: Secondary | ICD-10-CM | POA: Diagnosis not present

## 2022-12-28 DIAGNOSIS — R069 Unspecified abnormalities of breathing: Secondary | ICD-10-CM | POA: Diagnosis not present

## 2022-12-28 DIAGNOSIS — L299 Pruritus, unspecified: Secondary | ICD-10-CM | POA: Diagnosis not present

## 2022-12-28 DIAGNOSIS — I959 Hypotension, unspecified: Secondary | ICD-10-CM | POA: Diagnosis not present

## 2022-12-28 DIAGNOSIS — T7840XA Allergy, unspecified, initial encounter: Secondary | ICD-10-CM | POA: Diagnosis not present

## 2022-12-28 MED ORDER — HYDROXYZINE HCL 25 MG PO TABS: 25.0000 mg | ORAL_TABLET | Freq: Three times a day (TID) | ORAL | 0 refills | Status: AC | PRN

## 2022-12-28 MED ORDER — DEXAMETHASONE SODIUM PHOSPHATE 10 MG/ML IJ SOLN
10.0000 mg | Freq: Once | INTRAMUSCULAR | Status: AC
  Administered 2022-12-28: 10 mg via INTRAMUSCULAR
  Filled 2022-12-28: qty 1

## 2022-12-28 NOTE — ED Notes (Signed)
Pt has eczema and states she is having a flare

## 2022-12-28 NOTE — ED Triage Notes (Signed)
Pt bib EMS for eczema flare up. Pt states rash on arm, legs and stomach and itching nonstop. Pt went to PCP last week and prescribed a cream and a pill that is not helping and made itching worse. Pt denies any change in lotions, wash, or detergents.

## 2022-12-28 NOTE — Discharge Instructions (Addendum)
Patient received a shot of steroid in the emergency department.  I have sent hydroxyzine into the pharmacy for you to help with itching.  For any concerning symptoms return to the emergency department otherwise follow-up with dermatology.

## 2022-12-28 NOTE — ED Provider Notes (Signed)
[place] EMERGENCY DEPARTMENT AT [facility] Provider Note   CSN: [id number] Arrival date & time: 12/28/22  1620     History  Chief Complaint  Patient presents with   Rash    Beverly Mccann is a 62 y.o. female.  63 year old female presents today for evaluation of eczema flareup.  States this started about a week ago.  She has been seen by her primary care provider and prescribed triamcinolone cream 0.1% and referred to dermatology.  She states the pruritus has not improved.  She is requesting additional medication.  She denies fever, drainage from these areas, or other concerning complaints.  She has not seen dermatology yet.  The history is provided by the patient. No language interpreter was used.       Home Medications Prior to Admission medications   Medication Sig Start Date End Date Taking? Authorizing Provider  acetaminophen (TYLENOL) 500 MG tablet Take 500 mg by mouth every 6 (six) hours as needed.    [provider]  diclofenac Sodium (VOLTAREN) 1 % GEL Apply 4 g topically 4 (four) times daily. 11/22/22   Leath-Warren, Sadie Haber, NP  FARXIGA 5 MG TABS tablet TAKE 1 TABLET BY MOUTH ONCE DAILY BEFORE BREAKFAST 12/13/22   Gilmore Laroche, FNP  levocetirizine (XYZAL) 5 MG tablet Take 1 tablet (5 mg total) by mouth every evening. 12/20/22   Del Nigel Berthold, FNP  metFORMIN (GLUCOPHAGE) 500 MG tablet Take 1 tablet (500 mg total) by mouth 2 (two) times daily with a meal. 05/15/22 12/20/22  Gilmore Laroche, FNP  nicotine (NICODERM CQ) 21 mg/24hr patch Place 1 patch (21 mg total) onto the skin daily. 12/20/22   Del Orbe Polanco, Tenna Child, FNP  Olmesartan-amLODIPine-HCTZ 40-5-25 MG TABS Take 1 tablet by mouth daily. 09/20/22   Gilmore Laroche, FNP  predniSONE (DELTASONE) 50 MG tablet Take one tablet with breakfast for the next 5 days. 11/22/22   Leath-Warren, Sadie Haber, NP  triamcinolone cream (KENALOG) 0.1 % Apply 1 Application topically 2 (two) times daily.  12/05/22   Gilmore Laroche, FNP  Vitamin D, Ergocalciferol, (DRISDOL) 1.25 MG (50000 UNIT) CAPS capsule Take 1 capsule by mouth once a week 12/01/22   Gilmore Laroche, FNP      Allergies    Clindamycin/lincomycin    Review of Systems   Review of Systems  Constitutional:  Negative for fever.  Skin:  Positive for rash.  All other systems reviewed and are negative.   Physical Exam Updated Vital Signs BP 131/75 (BP Location: Right Arm)   Pulse (!) 102   Temp 97.8 F (36.6 C) (Oral)   Resp 17   Wt 105 kg   SpO2 100%   BMI 42.34 kg/m  Physical Exam Vitals and nursing note reviewed.  Constitutional:      General: She is not in acute distress.    Appearance: Normal appearance. She is not ill-appearing.  HENT:     Head: Normocephalic and atraumatic.     Nose: Nose normal.  Eyes:     Conjunctiva/sclera: Conjunctivae normal.  Pulmonary:     Effort: Pulmonary effort is normal. No respiratory distress.  Musculoskeletal:        General: No deformity.  Skin:    Findings: Rash (Bilateral upper extremities, lower extremities.  Consistent with eczema) present.  Neurological:     Mental Status: She is alert.     ED Results / Procedures / Treatments   Labs (all labs ordered are listed, but only abnormal results  are displayed) Labs Reviewed - No data to display  EKG None  Radiology No results found.  Procedures Procedures    Medications Ordered in ED Medications  dexamethasone (DECADRON) injection 10 mg (has no administration in time range)    ED Course/ Medical Decision Making/ A&P                             Medical Decision Making Risk Prescription drug management.   62 year old female with history of eczema presents today for concern of eczema flareup.  Prescribed triamcinolone cream recently.  Given dermatology referral but has not followed up.  Will prescribe Atarax for pruritus, and give dose of Decadron in the emergency department.  She is otherwise stable  for discharge.  No concerning signs or symptoms associated with the rash today.  Return precautions discussed.  Patient voices understanding and is in agreement with plan.   Final Clinical Impression(s) / ED Diagnoses Final diagnoses:  Eczema, unspecified type    Rx / DC Orders ED Discharge Orders          Ordered    hydrOXYzine (ATARAX) 25 MG tablet  Every 8 hours PRN        12/28/22 1940              Marita Kansas, PA-C 12/28/22 1940    Linwood Dibbles, MD 12/28/22 2329

## 2023-01-05 ENCOUNTER — Other Ambulatory Visit: Payer: Self-pay | Admitting: Family Medicine

## 2023-01-05 DIAGNOSIS — L308 Other specified dermatitis: Secondary | ICD-10-CM

## 2023-01-06 ENCOUNTER — Ambulatory Visit
Admission: EM | Admit: 2023-01-06 | Discharge: 2023-01-06 | Disposition: A | Discharge status: Home / Self Care | Payer: BC Managed Care – PPO | Attending: Family Medicine | Admitting: Family Medicine

## 2023-01-06 DIAGNOSIS — I10 Essential (primary) hypertension: Secondary | ICD-10-CM | POA: Diagnosis not present

## 2023-01-06 DIAGNOSIS — L209 Atopic dermatitis, unspecified: Secondary | ICD-10-CM | POA: Diagnosis not present

## 2023-01-06 MED ORDER — CLOBETASOL PROPIONATE 0.05 % EX OINT: 1.0000 | TOPICAL_OINTMENT | Freq: Two times a day (BID) | CUTANEOUS | 1 refills | Status: AC

## 2023-01-06 NOTE — Discharge Instructions (Signed)
Your blood pressure was noted to be elevated during your visit today. If you are currently taking medication for high blood pressure, please ensure you are taking this as directed. If you do not have a history of high blood pressure and your blood pressure remains persistently elevated, you may need to begin taking a medication at some point. You may return here within the next few days to recheck if unable to see your primary care provider or if you do not have a one.  BP (!) 161/83 (BP Location: Right Arm)   Pulse 79   Temp 97.6 F (36.4 C) (Oral)   Resp 18   SpO2 100%   BP Readings from Last 3 Encounters:  01/06/23 (!) 161/83  12/28/22 131/75  12/20/22 (!) 147/80

## 2023-01-06 NOTE — ED Triage Notes (Signed)
Pt states she is having a severe eczema flare up on her right knee/leg. Pt states she has used a lot of eczema  treatments but nothing is  improving hr sxs. She has some yellow discoloration on her right knee. It also makes her knee stiff at times.

## 2023-01-08 NOTE — ED Provider Notes (Signed)
[facility]   [id number] 01/06/23 Arrival Time: 1137  ASSESSMENT & PLAN:  1. Atopic dermatitis, unspecified type   2. Elevated blood pressure reading with diagnosis of hypertension    No signs of skin infection. Begin: Meds ordered this encounter  Medications   clobetasol ointment (TEMOVATE) 0.05 %    Sig: Apply 1 Application topically 2 (two) times daily.    Dispense:  120 g    Refill:  1     Discharge Instructions      Your blood pressure was noted to be elevated during your visit today. If you are currently taking medication for high blood pressure, please ensure you are taking this as directed. If you do not have a history of high blood pressure and your blood pressure remains persistently elevated, you may need to begin taking a medication at some point. You may return here within the next few days to recheck if unable to see your primary care provider or if you do not have a one.  BP (!) 161/83 (BP Location: Right Arm)   Pulse 79   Temp 97.6 F (36.4 C) (Oral)   Resp 18   SpO2 100%   BP Readings from Last 3 Encounters:  01/06/23 (!) 161/83  12/28/22 131/75  12/20/22 (!) 147/80         Follow-up Information     Gilmore Laroche, FNP.   Specialty: Family Medicine Why: To follow up on your blood pressure. Contact information: [address] [place] [address] [phone]                 Will follow up with PCP or here if worsening or failing to improve as anticipated. Reviewed expectations re: course of current medical issues. Questions answered. Outlined signs and symptoms indicating need for more acute intervention. Patient verbalized understanding. After Visit Summary given.   SUBJECTIVE:  Beverly Mccann is a 62 y.o. female who presents with a skin complaint. Pt states she is having a severe eczema flare up on her right knee/leg. Pt states she has used a lot of eczema  treatments but nothing is  improving hr sxs. Weeks to  months. Does itch. Increased blood pressure noted today. Reports that she is treated for BP.  She reports taking medications as instructed, no chest pain on exertion, no dyspnea on exertion, no swelling of ankles, no orthostatic dizziness or lightheadedness, and no orthopnea or paroxysmal nocturnal dyspnea.   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 Psychological: alert and cooperative; normal mood and affect  Allergies  Allergen Reactions   Clindamycin/Lincomycin Rash    Past Medical History:  Diagnosis Date   Hypercholesteremia    Hypertension    Social History   Socioeconomic History   Marital status: Married    Spouse name: Not on file   Number of children: Not on file   Years of education: Not on file   Highest education level: Not on file  Occupational History   Not on file  Tobacco Use   Smoking status: Former    Packs/day: 0.50    Years: 15.00    Additional  pack years: 0.00    Total pack years: 7.50    Types: Cigarettes    Quit date: 11/12/2011    Years since quitting: 11.1   Smokeless tobacco: Never  Vaping Use   Vaping Use: Never used  Substance and Sexual Activity   Alcohol use: Yes    Comment: occasionally   Drug use: No   Sexual activity: Yes    Birth control/protection: Surgical  Other Topics Concern   Not on file  Social History Narrative   Not on file   Social Determinants of Health   Financial Resource Strain: Not on file  Food Insecurity: Not on file  Transportation Needs: Not on file  Physical Activity: Not on file  Stress: Not on file  Social Connections: Not on file  Intimate Partner  Violence: Not on file   Family History  Problem Relation Age of Onset   Hypertension Mother    Diabetes Mother    Hypertension Father    Past Surgical History:  Procedure Laterality Date   ABDOMINAL HYSTERECTOMY     COLONOSCOPY  02/12/2012   Procedure: COLONOSCOPY;  Surgeon: West Bali, MD;  Location: [facility];  Service: Endoscopy;  Laterality: N/A;  10:30 AM   Cyst removed from right breast        Mardella Layman, MD 01/08/23 1328

## 2023-01-09 DIAGNOSIS — L2082 Flexural eczema: Secondary | ICD-10-CM | POA: Diagnosis not present

## 2023-01-23 ENCOUNTER — Other Ambulatory Visit: Payer: Self-pay | Admitting: Family Medicine

## 2023-01-23 DIAGNOSIS — E119 Type 2 diabetes mellitus without complications: Secondary | ICD-10-CM

## 2023-01-23 DIAGNOSIS — L2082 Flexural eczema: Secondary | ICD-10-CM | POA: Diagnosis not present

## 2023-01-31 ENCOUNTER — Telehealth: Payer: Self-pay | Admitting: Family Medicine

## 2023-02-01 ENCOUNTER — Ambulatory Visit (INDEPENDENT_AMBULATORY_CARE_PROVIDER_SITE_OTHER): Payer: BC Managed Care – PPO | Admitting: Family Medicine

## 2023-02-01 ENCOUNTER — Encounter: Payer: Self-pay | Admitting: Family Medicine

## 2023-02-01 VITALS — BP 124/72 | HR 62 | Ht 64.0 in | Wt 250.0 lb

## 2023-02-01 DIAGNOSIS — Z720 Tobacco use: Secondary | ICD-10-CM | POA: Insufficient documentation

## 2023-02-01 DIAGNOSIS — R202 Paresthesia of skin: Secondary | ICD-10-CM

## 2023-02-01 NOTE — Patient Instructions (Signed)
I appreciate the opportunity to provide care to you today!    Follow up:  3 months  Labs: please stop by the lab today to get your blood drawn (BMP and Vit B12)  Numbness and tingling Please start taking over-the-counter vitamin B12 1000 mcg daily  Please follow-up with me if your symptoms are relief after 4 weeks of starting over-the-counter supplement vitamin B12   Please continue to a heart-healthy diet and increase your physical activities. Try to exercise for at least five days a week.      It was a pleasure to see you and I look forward to continuing to work together on your health and well-being. Please do not hesitate to call the office if you need care or have questions about your care.   Have a wonderful day and week. With Gratitude, Gilmore Laroche MSN, FNP-BC

## 2023-02-01 NOTE — Progress Notes (Signed)
Established Patient Office Visit  Subjective:  Patient ID: Beverly Mccann, female    DOB: Jul 14, 1961  Age: 62 y.o. MRN: [id number]  CC:  Chief Complaint  Patient presents with   Chronic Care Management    6 week f/u. Pt reports eczema flare, sees specialist. Reports right hand tingling arm going numb,     HPI Beverly Mccann is a 62 y.o. female with past medical history of hypertension and tobacco use presents for f/u of smoking cessation. For the details of today's visit, please refer to the assessment and plan.     Past Medical History:  Diagnosis Date   Hypercholesteremia    Hypertension     Past Surgical History:  Procedure Laterality Date   ABDOMINAL HYSTERECTOMY     COLONOSCOPY  02/12/2012   Procedure: COLONOSCOPY;  Surgeon: West Bali, MD;  Location: [facility];  Service: Endoscopy;  Laterality: N/A;  10:30 AM   Cyst removed from right breast      Family History  Problem Relation Age of Onset   Hypertension Mother    Diabetes Mother    Hypertension Father     Social History   Socioeconomic History   Marital status: Married    Spouse name: Not on file   Number of children: Not on file   Years of education: Not on file   Highest education level: Not on file  Occupational History   Not on file  Tobacco Use   Smoking status: Former    Packs/day: 0.50    Years: 15.00    Additional pack years: 0.00    Total pack years: 7.50    Types: Cigarettes    Quit date: 11/12/2011    Years since quitting: 11.2   Smokeless tobacco: Never  Vaping Use   Vaping Use: Never used  Substance and Sexual Activity   Alcohol use: Yes    Comment: occasionally   Drug use: No   Sexual activity: Yes    Birth control/protection: Surgical  Other Topics Concern   Not on file  Social History Narrative   Not on file   Social Determinants of Health   Financial Resource Strain: Not on file  Food Insecurity: Not on file  Transportation Needs: Not on file  Physical Activity:  Not on file  Stress: Not on file  Social Connections: Not on file  Intimate Partner Violence: Not on file    Outpatient Medications Prior to Visit  Medication Sig Dispense Refill   acetaminophen (TYLENOL) 500 MG tablet Take 500 mg by mouth every 6 (six) hours as needed.     clobetasol ointment (TEMOVATE) 0.05 % Apply 1 Application topically 2 (two) times daily. 120 g 1   diclofenac Sodium (VOLTAREN) 1 % GEL Apply 4 g topically 4 (four) times daily. 150 g 0   FARXIGA 5 MG TABS tablet TAKE 1 TABLET BY MOUTH ONCE DAILY BEFORE BREAKFAST 30 tablet 0   hydrOXYzine (ATARAX) 25 MG tablet Take 1 tablet (25 mg total) by mouth every 8 (eight) hours as needed for itching. 12 tablet 0   levocetirizine (XYZAL) 5 MG tablet Take 1 tablet (5 mg total) by mouth every evening. 30 tablet 0   nicotine (NICODERM CQ) 21 mg/24hr patch Place 1 patch (21 mg total) onto the skin daily. 28 patch 0   Olmesartan-amLODIPine-HCTZ 40-5-25 MG TABS Take 1 tablet by mouth daily. 90 tablet 1   predniSONE (DELTASONE) 50 MG tablet Take one tablet with breakfast for the  next 5 days. 5 tablet 0   triamcinolone cream (KENALOG) 0.1 % APPLY CREAM TOPICALLY TWICE DAILY 15 g 0   Vitamin D, Ergocalciferol, (DRISDOL) 1.25 MG (50000 UNIT) CAPS capsule Take 1 capsule by mouth once a week 5 capsule 0   metFORMIN (GLUCOPHAGE) 500 MG tablet Take 1 tablet (500 mg total) by mouth 2 (two) times daily with a meal. 60 tablet 1   No facility-administered medications prior to visit.    Allergies  Allergen Reactions   Clindamycin/Lincomycin Rash    ROS Review of Systems  Constitutional:  Negative for chills and fever.  Eyes:  Negative for visual disturbance.  Respiratory:  Negative for chest tightness and shortness of breath.   Musculoskeletal:        Paresthesia of the right hand  Neurological:  Negative for dizziness and headaches.      Objective:    Physical Exam HENT:     Head: Normocephalic.     Mouth/Throat:     Mouth:  Mucous membranes are moist.  Cardiovascular:     Rate and Rhythm: Normal rate.     Heart sounds: Normal heart sounds.  Pulmonary:     Effort: Pulmonary effort is normal.     Breath sounds: Normal breath sounds.  Neurological:     Mental Status: She is alert.     Sensory: Sensation is intact.     Motor: No weakness or tremor.     Coordination: Coordination normal. Finger-Nose-Finger Test normal.     Gait: Gait normal.     BP 124/72   Pulse 62   Ht 5\' 4"  (1.626 m)   Wt 250 lb (113.4 kg)   SpO2 97%   BMI 42.91 kg/m  Wt Readings from Last 3 Encounters:  02/01/23 250 lb (113.4 kg)  12/28/22 231 lb 7.7 oz (105 kg)  12/20/22 232 lb 1.9 oz (105.3 kg)    Lab Results  Component Value Date   TSH 1.520 02/24/2022   Lab Results  Component Value Date   WBC 6.8 02/24/2022   HGB 11.9 02/24/2022   HCT 35.0 02/24/2022   MCV 80 02/24/2022   PLT 226 02/24/2022   Lab Results  Component Value Date   NA 142 02/24/2022   K 3.4 (L) 02/24/2022   CO2 22 02/24/2022   GLUCOSE 123 (H) 02/24/2022   BUN 13 02/24/2022   CREATININE 0.66 02/24/2022   BILITOT 0.4 02/24/2022   ALKPHOS 76 02/24/2022   AST 21 02/24/2022   ALT 24 02/24/2022   PROT 7.2 02/24/2022   ALBUMIN 4.4 02/24/2022   CALCIUM 9.0 02/24/2022   ANIONGAP 12 08/10/2021   EGFR 100 02/24/2022   Lab Results  Component Value Date   CHOL 209 (H) 02/24/2022   Lab Results  Component Value Date   HDL 79 02/24/2022   Lab Results  Component Value Date   LDLCALC 117 (H) 02/24/2022   Lab Results  Component Value Date   TRIG 75 02/24/2022   Lab Results  Component Value Date   CHOLHDL 2.6 02/24/2022   Lab Results  Component Value Date   HGBA1C 7.2 (H) 02/24/2022      Assessment & Plan:  Paresthesia Assessment & Plan: Complains of intermittent paresthesia of the right hand only Denies Stroke-like symptoms Will assess BMP today and vitamin B12 Encouraged to start taking vitamin B12 1000 mcg daily Encouraged to  schedule an appointment in 4 weeks if her symptoms are unrelenting   Orders: -  BMP8+EGFR -     Vitamin B12  Tobacco use Assessment & Plan: The patient reports quitting smoking cold [place] 2 months ago She received the nicotine patch but has not used it Congratulated patient on smoking cessation and to keep up the good work      Follow-up: Return in about 3 months (around 05/04/2023).   Gilmore Laroche, FNP

## 2023-02-01 NOTE — Assessment & Plan Note (Signed)
The patient reports quitting smoking cold [place] 2 months ago She received the nicotine patch but has not used it Congratulated patient on smoking cessation and to keep up the good work

## 2023-02-01 NOTE — Assessment & Plan Note (Signed)
She is following up with her allergist next month She was prescribed topical corticosteroid and indicated adequate refills Encouraged to continue current prescription

## 2023-02-01 NOTE — Assessment & Plan Note (Addendum)
Complains of intermittent paresthesia of the right hand only Denies Stroke-like symptoms Will assess BMP today and vitamin B12 Encouraged to start taking vitamin B12 1000 mcg daily Encouraged to schedule an appointment in 4 weeks if her symptoms are unrelenting

## 2023-02-02 ENCOUNTER — Telehealth: Payer: Self-pay

## 2023-02-02 ENCOUNTER — Ambulatory Visit: Payer: BC Managed Care – PPO

## 2023-02-02 NOTE — Telephone Encounter (Signed)
Would like to know if she needs to schedule an appt just for this?

## 2023-02-02 NOTE — Telephone Encounter (Signed)
Yes please

## 2023-03-01 NOTE — Telephone Encounter (Signed)
ED Follow Up Outreach  Initial Outreach Documentation  What specific health condition(s) do you have that needs to be monitored by a doctor? 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 Patient reports following Dr. recommendation putting on cream and medicine.   Some of the symptoms that were reported during your emergency room visits could be managed by a primary care provider. When considering your emergency room visits, did you call your primary care doctor to report your symptoms or illness before going to the emergency room?  Patient reports No, I did not know that i could do that.   Do you have access to resources or alternative options to help you avoid going to the ED unless is an emergency? If not, what resources or alternative options do you feel you need?  Patient reports Yes, I would like to learn more about MyChart   Is there something that you would like to learn more about: Stress Management Anxiety management Activity Tracking Mood/Depression Monitoring blood pressure/ weight /diet Physical activity / Exercise  When is a good time to call you back in the next 2 weeks? During this call we will review the resources you need and discuss when to call your primary care doctor for health issues.  Patient reports Yes I would like to received resources and for my PCP to be involve.

## 2023-03-12 ENCOUNTER — Ambulatory Visit: Payer: BC Managed Care – PPO | Admitting: Family Medicine

## 2023-04-06 ENCOUNTER — Other Ambulatory Visit: Payer: Self-pay | Admitting: Family Medicine

## 2023-04-06 DIAGNOSIS — I10 Essential (primary) hypertension: Secondary | ICD-10-CM

## 2023-04-30 DIAGNOSIS — L309 Dermatitis, unspecified: Secondary | ICD-10-CM | POA: Diagnosis not present

## 2023-05-04 ENCOUNTER — Ambulatory Visit (INDEPENDENT_AMBULATORY_CARE_PROVIDER_SITE_OTHER): Payer: BC Managed Care – PPO | Admitting: Family Medicine

## 2023-05-04 ENCOUNTER — Encounter: Payer: Self-pay | Admitting: Family Medicine

## 2023-05-04 VITALS — BP 137/80 | HR 94 | Ht 64.0 in | Wt 248.1 lb

## 2023-05-04 DIAGNOSIS — Z7984 Long term (current) use of oral hypoglycemic drugs: Secondary | ICD-10-CM | POA: Diagnosis not present

## 2023-05-04 DIAGNOSIS — E1165 Type 2 diabetes mellitus with hyperglycemia: Secondary | ICD-10-CM | POA: Diagnosis not present

## 2023-05-04 DIAGNOSIS — I10 Essential (primary) hypertension: Secondary | ICD-10-CM

## 2023-05-04 DIAGNOSIS — E7849 Other hyperlipidemia: Secondary | ICD-10-CM

## 2023-05-04 DIAGNOSIS — M67442 Ganglion, left hand: Secondary | ICD-10-CM | POA: Insufficient documentation

## 2023-05-04 DIAGNOSIS — R7301 Impaired fasting glucose: Secondary | ICD-10-CM

## 2023-05-04 DIAGNOSIS — M67432 Ganglion, left wrist: Secondary | ICD-10-CM | POA: Diagnosis not present

## 2023-05-04 DIAGNOSIS — E559 Vitamin D deficiency, unspecified: Secondary | ICD-10-CM

## 2023-05-04 DIAGNOSIS — E038 Other specified hypothyroidism: Secondary | ICD-10-CM

## 2023-05-04 NOTE — Patient Instructions (Addendum)
I appreciate the opportunity to provide care to you today!    Follow up:  4 months  Labs: please stop by the lab  during the week to get your blood drawn (CBC, CMP, TSH, Lipid profile, HgA1c, Vit D)  Weight Loss Tips -Incorporate Nutrient-Dense Foods: Increase your intake of fruits, vegetables, and whole grains to enhance your diet with essential vitamins, minerals, and fiber. -Choose Lean Proteins: Opt for lean protein sources such as chicken, fish, beans, and legumes to support muscle maintenance and overall health. -Select Low-Fat Dairy Products: Include low-fat dairy options to obtain necessary calcium and protein while minimizing excess fat intake. -Reduce Saturated and Trans Fats: Limit your consumption of saturated fats, trans fatty acids, and cholesterol to promote heart health and support weight management. -Engage in Regular Physical Activity: Aim to be active for at least 30 minutes on most days of the week. Activities such as brisk walking can help you maintain a healthy weight and improve overall well-being.  Attached with your AVS, you will find valuable resources for self-education. I highly recommend dedicating some time to thoroughly examine them.   Please continue to a heart-healthy diet and increase your physical activities. Try to exercise for at least five days a week.    It was a pleasure to see you and I look forward to continuing to work together on your health and well-being. Please do not hesitate to call the office if you need care or have questions about your care.  In case of emergency, please visit the Emergency Department for urgent care, or contact our clinic at [phone] to schedule an appointment. We're here to help you!   Have a wonderful day and week. With Gratitude, Gilmore Laroche MSN, FNP-BC

## 2023-05-04 NOTE — Assessment & Plan Note (Signed)
The patient presents with a nontender ganglion cyst on the volar aspect of the left wrist and has expressed a desire for removal of the cyst. She is currently under the care of orthopedic surgery. I have encouraged her to continue following up with her orthopedic specialist for further evaluation and management

## 2023-05-04 NOTE — Assessment & Plan Note (Signed)
Controlled She takes olmesartan-amlodipine-hydrochlorothiazide 40-5-25 daily Asymptomatic today in the clinic Encouraged low-sodium diet with increased physical activity BP Readings from Last 3 Encounters:  05/04/23 137/80  02/01/23 124/72  01/06/23 (!) 161/83

## 2023-05-04 NOTE — Progress Notes (Signed)
Established Patient Office Visit  Subjective:  Patient ID: Beverly Mccann, female    DOB: [birth year]  Age: 62 y.o. MRN: [id number]  CC:  Chief Complaint  Patient presents with   Care Management    4 month f/u, needs refill on urine medication.     HPI Beverly Mccann is a 62 y.o. female with past medical history of essential hypertension type 2 diabetes and vitamin D deficiency presents for f/u of  chronic medical conditions. For the details of today's visit, please refer to the assessment and plan.     Past Medical History:  Diagnosis Date   Hypercholesteremia    Hypertension     Past Surgical History:  Procedure Laterality Date   ABDOMINAL HYSTERECTOMY     COLONOSCOPY  02/12/2012   Procedure: COLONOSCOPY;  Surgeon: West Bali, MD;  Location: [facility];  Service: Endoscopy;  Laterality: N/A;  10:30 AM   Cyst removed from right breast      Family History  Problem Relation Age of Onset   Hypertension Mother    Diabetes Mother    Hypertension Father     Social History   Socioeconomic History   Marital status: Married    Spouse name: Not on file   Number of children: Not on file   Years of education: Not on file   Highest education level: Not on file  Occupational History   Not on file  Tobacco Use   Smoking status: Former    Current packs/day: 0.00    Average packs/day: 0.5 packs/day for 15.0 years (7.5 ttl pk-yrs)    Types: Cigarettes    Start date: 11/11/1996    Quit date: 11/12/2011    Years since quitting: 11.4   Smokeless tobacco: Never  Vaping Use   Vaping status: Never Used  Substance and Sexual Activity   Alcohol use: Yes    Comment: occasionally   Drug use: No   Sexual activity: Yes    Birth control/protection: Surgical  Other Topics Concern   Not on file  Social History Narrative   Not on file   Social Determinants of Health   Financial Resource Strain: Not on file  Food Insecurity: Not on file  Transportation Needs: Not on file   Physical Activity: Not on file  Stress: Not on file  Social Connections: Unknown (01/16/2022)   Received from [facility]   Social Network    Social Network: Not on file  Intimate Partner Violence: Unknown (12/08/2021)   Received from [facility]   HITS    Physically Hurt: Not on file    Insult or Talk Down To: Not on file    Threaten Physical Harm: Not on file    Scream or Curse: Not on file    Outpatient Medications Prior to Visit  Medication Sig Dispense Refill   acetaminophen (TYLENOL) 500 MG tablet Take 500 mg by mouth every 6 (six) hours as needed.     clobetasol ointment (TEMOVATE) 0.05 % Apply 1 Application topically 2 (two) times daily. 120 g 1   diclofenac Sodium (VOLTAREN) 1 % GEL Apply 4 g topically 4 (four) times daily. 150 g 0   FARXIGA 5 MG TABS tablet TAKE 1 TABLET BY MOUTH ONCE DAILY BEFORE BREAKFAST 30 tablet 0   hydrOXYzine (ATARAX) 25 MG tablet Take 1 tablet (25 mg total) by mouth every 8 (eight) hours as needed for itching. 12 tablet 0   levocetirizine (XYZAL) 5 MG tablet Take 1  tablet (5 mg total) by mouth every evening. 30 tablet 0   nicotine (NICODERM CQ) 21 mg/24hr patch Place 1 patch (21 mg total) onto the skin daily. 28 patch 0   Olmesartan-amLODIPine-HCTZ 40-5-25 MG TABS Take 1 tablet by mouth once daily 90 tablet 0   predniSONE (DELTASONE) 50 MG tablet Take one tablet with breakfast for the next 5 days. 5 tablet 0   triamcinolone cream (KENALOG) 0.1 % APPLY CREAM TOPICALLY TWICE DAILY 15 g 0   Vitamin D, Ergocalciferol, (DRISDOL) 1.25 MG (50000 UNIT) CAPS capsule Take 1 capsule by mouth once a week 5 capsule 0   metFORMIN (GLUCOPHAGE) 500 MG tablet Take 1 tablet (500 mg total) by mouth 2 (two) times daily with a meal. 60 tablet 1   No facility-administered medications prior to visit.    Allergies  Allergen Reactions   Clindamycin/Lincomycin Rash    ROS Review of Systems  Constitutional:  Negative for chills and fever.  Eyes:  Negative for  visual disturbance.  Respiratory:  Negative for chest tightness and shortness of breath.   Neurological:  Negative for dizziness and headaches.      Objective:    Physical Exam HENT:     Head: Normocephalic.     Left Ear: External ear normal.     Mouth/Throat:     Mouth: Mucous membranes are moist.  Eyes:     Extraocular Movements: Extraocular movements intact.     Pupils: Pupils are equal, round, and reactive to light.  Cardiovascular:     Rate and Rhythm: Normal rate and regular rhythm.  Pulmonary:     Effort: Pulmonary effort is normal.     Breath sounds: Normal breath sounds.  Abdominal:     Palpations: Abdomen is soft.  Neurological:     Mental Status: She is alert.     BP 137/80   Pulse 94   Ht 5\' 4"  (1.626 m)   Wt 248 lb 1.3 oz (112.5 kg)   SpO2 98%   BMI 42.58 kg/m  Wt Readings from Last 3 Encounters:  05/04/23 248 lb 1.3 oz (112.5 kg)  02/01/23 250 lb (113.4 kg)  12/28/22 231 lb 7.7 oz (105 kg)    Lab Results  Component Value Date   TSH 1.520 02/24/2022   Lab Results  Component Value Date   WBC 6.8 02/24/2022   HGB 11.9 02/24/2022   HCT 35.0 02/24/2022   MCV 80 02/24/2022   PLT 226 02/24/2022   Lab Results  Component Value Date   NA 142 02/24/2022   K 3.4 (L) 02/24/2022   CO2 22 02/24/2022   GLUCOSE 123 (H) 02/24/2022   BUN 13 02/24/2022   CREATININE 0.66 02/24/2022   BILITOT 0.4 02/24/2022   ALKPHOS 76 02/24/2022   AST 21 02/24/2022   ALT 24 02/24/2022   PROT 7.2 02/24/2022   ALBUMIN 4.4 02/24/2022   CALCIUM 9.0 02/24/2022   ANIONGAP 12 08/10/2021   EGFR 100 02/24/2022   Lab Results  Component Value Date   CHOL 209 (H) 02/24/2022   Lab Results  Component Value Date   HDL 79 02/24/2022   Lab Results  Component Value Date   LDLCALC 117 (H) 02/24/2022   Lab Results  Component Value Date   TRIG 75 02/24/2022   Lab Results  Component Value Date   CHOLHDL 2.6 02/24/2022   Lab Results  Component Value Date   HGBA1C 7.2  (H) 02/24/2022      Assessment & Plan:  Type 2 diabetes mellitus with hyperglycemia, without long-term current use of insulin (HCC) Assessment & Plan: The patient denies experiencing polyuria, polyphagia, or polydipsia. She is uncertain whether she has been consistently taking metformin 500 mg twice daily. I have encouraged her to reduce her intake of high-sugar foods and beverages and to increase her physical activity to better manage her condition. Lab Results  Component Value Date   HGBA1C 7.2 (H) 02/24/2022     Orders: -     Microalbumin / creatinine urine ratio  Essential hypertension Assessment & Plan: Controlled She takes olmesartan-amlodipine-hydrochlorothiazide 40-5-25 daily Asymptomatic today in the clinic Encouraged low-sodium diet with increased physical activity BP Readings from Last 3 Encounters:  05/04/23 137/80  02/01/23 124/72  01/06/23 (!) 161/83      Ganglion cyst of volar aspect of left wrist Assessment & Plan: The patient presents with a nontender ganglion cyst on the volar aspect of the left wrist and has expressed a desire for removal of the cyst. She is currently under the care of orthopedic surgery. I have encouraged her to continue following up with her orthopedic specialist for further evaluation and management   IFG (impaired fasting glucose) -     Hemoglobin A1c  Vitamin D deficiency -     VITAMIN D 25 Hydroxy (Vit-D Deficiency, Fractures)  Other specified hypothyroidism -     TSH + free T4  Other hyperlipidemia -     Lipid panel -     CMP14+EGFR -     CBC with Differential/Platelet  Note: This chart has been completed using Engineer, civil (consulting) software, and while attempts have been made to ensure accuracy, certain words and phrases may not be transcribed as intended.    Follow-up: Return in about 4 months (around 09/03/2023).   Gilmore Laroche, FNP

## 2023-05-04 NOTE — Assessment & Plan Note (Signed)
The patient denies experiencing polyuria, polyphagia, or polydipsia. She is uncertain whether she has been consistently taking metformin 500 mg twice daily. I have encouraged her to reduce her intake of high-sugar foods and beverages and to increase her physical activity to better manage her condition. Lab Results  Component Value Date   HGBA1C 7.2 (H) 02/24/2022

## 2023-05-25 ENCOUNTER — Telehealth: Payer: Self-pay | Admitting: Family Medicine

## 2023-05-25 DIAGNOSIS — E559 Vitamin D deficiency, unspecified: Secondary | ICD-10-CM | POA: Diagnosis not present

## 2023-05-25 DIAGNOSIS — R7301 Impaired fasting glucose: Secondary | ICD-10-CM | POA: Diagnosis not present

## 2023-05-25 DIAGNOSIS — E038 Other specified hypothyroidism: Secondary | ICD-10-CM | POA: Diagnosis not present

## 2023-05-25 DIAGNOSIS — E7849 Other hyperlipidemia: Secondary | ICD-10-CM | POA: Diagnosis not present

## 2023-05-25 DIAGNOSIS — E119 Type 2 diabetes mellitus without complications: Secondary | ICD-10-CM | POA: Diagnosis not present

## 2023-05-25 NOTE — Telephone Encounter (Signed)
[facility] form  Noted  Copied Sleeved (put in provider box)  Call patient when ready (need back by 09.30.2024 if at all possible)

## 2023-05-26 LAB — CBC WITH DIFFERENTIAL/PLATELET
Basophils Absolute: 0 10*3/uL (ref 0.0–0.2)
Basos: 1 %
EOS (ABSOLUTE): 0.2 10*3/uL (ref 0.0–0.4)
Eos: 3 %
Hematocrit: 35.9 % (ref 34.0–46.6)
Hemoglobin: 11.3 g/dL (ref 11.1–15.9)
Immature Grans (Abs): 0 10*3/uL (ref 0.0–0.1)
Immature Granulocytes: 0 %
Lymphocytes Absolute: 3.7 10*3/uL — ABNORMAL HIGH (ref 0.7–3.1)
Lymphs: 44 %
MCH: 26.3 pg — ABNORMAL LOW (ref 26.6–33.0)
MCHC: 31.5 g/dL (ref 31.5–35.7)
MCV: 84 fL (ref 79–97)
Monocytes Absolute: 0.6 10*3/uL (ref 0.1–0.9)
Monocytes: 7 %
Neutrophils Absolute: 3.7 10*3/uL (ref 1.4–7.0)
Neutrophils: 45 %
Platelets: 257 10*3/uL (ref 150–450)
RBC: 4.3 x10E6/uL (ref 3.77–5.28)
RDW: 13.3 % (ref 11.7–15.4)
WBC: 8.1 10*3/uL (ref 3.4–10.8)

## 2023-05-26 LAB — LIPID PANEL
Chol/HDL Ratio: 2.8 ratio (ref 0.0–4.4)
Cholesterol, Total: 202 mg/dL — ABNORMAL HIGH (ref 100–199)
HDL: 72 mg/dL (ref >39)
LDL Chol Calc (NIH): 111 mg/dL — ABNORMAL HIGH (ref 0–99)
Triglycerides: 110 mg/dL (ref 0–149)
VLDL Cholesterol Cal: 19 mg/dL (ref 5–40)

## 2023-05-26 LAB — CMP14+EGFR
ALT: 23 IU/L (ref 0–32)
AST: 20 IU/L (ref 0–40)
Albumin: 4.5 g/dL (ref 3.9–4.9)
Alkaline Phosphatase: 75 IU/L (ref 44–121)
BUN/Creatinine Ratio: 21 (ref 12–28)
BUN: 17 mg/dL (ref 8–27)
Bilirubin Total: 0.4 mg/dL (ref 0.0–1.2)
CO2: 24 mmol/L (ref 20–29)
Calcium: 10 mg/dL (ref 8.7–10.3)
Chloride: 101 mmol/L (ref 96–106)
Creatinine, Ser: 0.8 mg/dL (ref 0.57–1.00)
Globulin, Total: 2.8 g/dL (ref 1.5–4.5)
Glucose: 126 mg/dL — ABNORMAL HIGH (ref 70–99)
Potassium: 3.8 mmol/L (ref 3.5–5.2)
Sodium: 141 mmol/L (ref 134–144)
Total Protein: 7.3 g/dL (ref 6.0–8.5)
eGFR: 83 mL/min/{1.73_m2} (ref >59)

## 2023-05-26 LAB — TSH+FREE T4
Free T4: 1.19 ng/dL (ref 0.82–1.77)
TSH: 0.809 u[IU]/mL (ref 0.450–4.500)

## 2023-05-26 LAB — HEMOGLOBIN A1C
Est. average glucose Bld gHb Est-mCnc: 163 mg/dL
Hgb A1c MFr Bld: 7.3 % — ABNORMAL HIGH (ref 4.8–5.6)

## 2023-05-26 LAB — VITAMIN D 25 HYDROXY (VIT D DEFICIENCY, FRACTURES): Vit D, 25-Hydroxy: 18.3 ng/mL — ABNORMAL LOW (ref 30.0–100.0)

## 2023-05-27 LAB — MICROALBUMIN / CREATININE URINE RATIO
Creatinine, Urine: 135.1 mg/dL
Microalb/Creat Ratio: 35 mg/g creat — ABNORMAL HIGH (ref 0–29)
Microalbumin, Urine: 47.1 ug/mL

## 2023-05-29 ENCOUNTER — Other Ambulatory Visit: Payer: Self-pay | Admitting: Family Medicine

## 2023-05-29 DIAGNOSIS — E785 Hyperlipidemia, unspecified: Secondary | ICD-10-CM

## 2023-05-29 DIAGNOSIS — E559 Vitamin D deficiency, unspecified: Secondary | ICD-10-CM

## 2023-05-29 DIAGNOSIS — E119 Type 2 diabetes mellitus without complications: Secondary | ICD-10-CM

## 2023-05-29 MED ORDER — FARXIGA 5 MG PO TABS: 5.0000 mg | ORAL_TABLET | Freq: Every day | ORAL | 1 refills | Status: DC

## 2023-05-29 MED ORDER — METFORMIN HCL 500 MG PO TABS: 500.0000 mg | ORAL_TABLET | Freq: Two times a day (BID) | ORAL | 1 refills | Status: DC

## 2023-05-29 MED ORDER — ROSUVASTATIN CALCIUM 10 MG PO TABS: 10.0000 mg | ORAL_TABLET | Freq: Every day | ORAL | 3 refills | Status: DC

## 2023-05-29 MED ORDER — VITAMIN D (ERGOCALCIFEROL) 1.25 MG (50000 UNIT) PO CAPS: 50000.0000 [IU] | ORAL_CAPSULE | ORAL | 1 refills | Status: DC

## 2023-05-29 NOTE — Progress Notes (Signed)
Please inform the patient that her refill for metformin and Basaglar has been sent to her pharmacy to resume therapy. Her hemoglobin A1c has increased from 7.2 to 7.3, and I want it to be less than 7.0. I recommend reducing her intake of high-sugar foods and beverages while increasing physical activity. A refill for the vitamin D supplement has also been sent to her pharmacy to resume therapy, as her vitamin D levels are low. Additionally, her cholesterol levels are elevated, and I want her LDL cholesterol to be less than 70. I have started her on rosuvastatin 10 mg daily for cholesterol management and recommend decreasing her intake of greasy, fatty, and starchy foods, along with increased physical activity.

## 2023-07-12 ENCOUNTER — Other Ambulatory Visit: Payer: Self-pay | Admitting: Family Medicine

## 2023-07-12 DIAGNOSIS — I10 Essential (primary) hypertension: Secondary | ICD-10-CM

## 2023-07-27 ENCOUNTER — Other Ambulatory Visit: Payer: Self-pay | Admitting: Family Medicine

## 2023-07-27 DIAGNOSIS — E119 Type 2 diabetes mellitus without complications: Secondary | ICD-10-CM

## 2023-08-22 ENCOUNTER — Other Ambulatory Visit: Payer: Self-pay | Admitting: Family Medicine

## 2023-08-22 DIAGNOSIS — E119 Type 2 diabetes mellitus without complications: Secondary | ICD-10-CM

## 2023-08-30 ENCOUNTER — Other Ambulatory Visit: Payer: Self-pay | Admitting: Family Medicine

## 2023-08-30 DIAGNOSIS — E119 Type 2 diabetes mellitus without complications: Secondary | ICD-10-CM

## 2023-09-03 ENCOUNTER — Ambulatory Visit: Payer: BC Managed Care – PPO | Admitting: Family Medicine

## 2023-09-08 ENCOUNTER — Ambulatory Visit
Admission: EM | Admit: 2023-09-08 | Discharge: 2023-09-08 | Disposition: A | Discharge status: Home / Self Care | Payer: BC Managed Care – PPO | Attending: Nurse Practitioner | Admitting: Nurse Practitioner

## 2023-09-08 DIAGNOSIS — Z8739 Personal history of other diseases of the musculoskeletal system and connective tissue: Secondary | ICD-10-CM

## 2023-09-08 DIAGNOSIS — M79672 Pain in left foot: Secondary | ICD-10-CM

## 2023-09-08 MED ORDER — MELOXICAM 7.5 MG PO TABS: 7.5000 mg | ORAL_TABLET | Freq: Every day | ORAL | 0 refills | Status: AC

## 2023-09-08 MED ORDER — DICLOFENAC SODIUM 1 % EX GEL: 4.0000 g | Freq: Four times a day (QID) | CUTANEOUS | 0 refills | Status: AC

## 2023-09-08 NOTE — ED Triage Notes (Signed)
 Left foot pain that started yesterday. Patient denies any recent trauma or falls.

## 2023-09-08 NOTE — ED Provider Notes (Signed)
 [facility]    CSN: [id number] Arrival date & time: 09/08/23  1139      History   Chief Complaint Chief Complaint  Patient presents with   Foot Pain    HPI [name] is a 63 y.o. female.   The history is provided by the patient.   Patient presents for complaints of left foot pain. This is a chronic condition for the patient. She reports symptoms worsened over the past 1 to 2 days.  She states pain worsens when she is weightbearing.  She states pain is in the top of her foot under the big toe.  Patient denies injury, trauma, numbness, tingling, or radiation of pain.  Patient states that she has tried over-the-counter medication with minimal relief.  She also states that she applied ice to the foot.  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  States that she does wear good supportive shoes when working.  Past Medical History:  Diagnosis Date   Hypercholesteremia    Hypertension     Patient Active Problem List   Diagnosis Date Noted   Type 2 diabetes mellitus with hyperglycemia (HCC) 05/04/2023   Paresthesia 02/01/2023   Cyst of mouth 12/20/2022   Encounter for smoking cessation counseling 12/20/2022   Ganglion cyst of volar aspect of left wrist 08/15/2022   Pap smear for cervical cancer screening 08/15/2022   Atopic dermatitis 02/24/2022   BRONCHITIS, ACUTE 10/26/2008   MYALGIA 10/26/2008   ANEMIA, NORMOCYTIC 10/25/2007   HYPERLIPIDEMIA 08/18/2006   OBESITY NOS 08/18/2006   Essential hypertension 08/18/2006   ECZEMA 08/18/2006   Osteoarthritis 08/18/2006   Other specified abnormal findings of blood chemistry 08/18/2006    Past Surgical History:  Procedure Laterality Date   ABDOMINAL HYSTERECTOMY     COLONOSCOPY  02/12/2012   Procedure: COLONOSCOPY;  Surgeon: Margo LITTIE Haddock, MD;  Location: [facility];  Service: Endoscopy;  Laterality: N/A;  10:30 AM   Cyst removed from right breast      OB History     Gravida  3    Para  3   Term  3   Preterm      AB      Living  3      SAB      IAB      Ectopic      Multiple      Live Births               Home Medications    Prior to Admission medications   Medication Sig Start Date End Date Taking? Authorizing Provider  acetaminophen  (TYLENOL ) 500 MG tablet Take 500 mg by mouth every 6 (six) hours as needed.   Yes [provider]  clobetasol  ointment (TEMOVATE ) 0.05 % Apply 1 Application topically 2 (two) times daily. 01/06/23  Yes Hagler, Redell, MD  diclofenac  Sodium (VOLTAREN ) 1 % GEL Apply 4 g topically 4 (four) times daily. 09/08/23  Yes Leath-Warren, Etta PARAS, NP  FARXIGA  5 MG TABS tablet TAKE 1 TABLET BY MOUTH ONCE DAILY BEFORE BREAKFAST 08/30/23  Yes Zarwolo, Gloria, FNP  hydrOXYzine  (ATARAX ) 25 MG tablet Take 1 tablet (25 mg total) by mouth every 8 (eight) hours as needed for itching. 12/28/22  Yes Ali, Amjad, PA-C  levocetirizine (XYZAL ) 5 MG tablet Take 1 tablet (5 mg total) by mouth every evening. 12/20/22  Yes Del Orbe Polanco, Hilario, FNP  meloxicam  (MOBIC ) 7.5 MG tablet Take 1 tablet (7.5  mg total) by mouth daily. 09/08/23  Yes Leath-Warren, Etta PARAS, NP  metFORMIN  (GLUCOPHAGE ) 500 MG tablet TAKE 1 TABLET BY MOUTH TWICE DAILY WITH A MEAL 08/22/23  Yes Zarwolo, Gloria, FNP  nicotine  (NICODERM CQ ) 21 mg/24hr patch Place 1 patch (21 mg total) onto the skin daily. 12/20/22  Yes Del Orbe Polanco, Iliana, FNP  Olmesartan -amLODIPine -HCTZ 40-5-25 MG TABS Take 1 tablet by mouth once daily 07/12/23  Yes Zarwolo, Gloria, FNP  rosuvastatin  (CRESTOR ) 10 MG tablet Take 1 tablet (10 mg total) by mouth daily. 05/29/23  Yes Zarwolo, Gloria, FNP  triamcinolone  cream (KENALOG ) 0.1 % APPLY CREAM TOPICALLY TWICE DAILY 01/05/23  Yes Zarwolo, Gloria, FNP  Vitamin D , Ergocalciferol , (DRISDOL ) 1.25 MG (50000 UNIT) CAPS capsule Take 1 capsule (50,000 Units total) by mouth once a week. 05/29/23  Yes Zarwolo, Gloria, FNP  predniSONE  (DELTASONE ) 50 MG tablet Take  one tablet with breakfast for the next 5 days. 11/22/22   Leath-Warren, Etta PARAS, NP    Family History Family History  Problem Relation Age of Onset   Hypertension Mother    Diabetes Mother    Hypertension Father     Social History Social History   Tobacco Use   Smoking status: Former    Current packs/day: 0.00    Average packs/day: 0.5 packs/day for 15.0 years (7.5 ttl pk-yrs)    Types: Cigarettes    Start date: 11/11/1996    Quit date: 11/12/2011    Years since quitting: 11.8   Smokeless tobacco: Never  Vaping Use   Vaping status: Never Used  Substance Use Topics   Alcohol use: Yes    Comment: occasionally   Drug use: No     Allergies   Clindamycin/lincomycin   Review of Systems Review of Systems Per HPI  Physical Exam Triage Vital Signs ED Triage Vitals [09/08/23 1330]  Encounter Vitals Group     BP (!) 160/90     Systolic BP Percentile      Diastolic BP Percentile      Pulse Rate 99     Resp 16     Temp 98 F (36.7 C)     Temp Source Oral     SpO2 98 %     Weight      Height      Head Circumference      Peak Flow      Pain Score      Pain Loc      Pain Education      Exclude from Growth Chart    No data found.  Updated Vital Signs BP (!) 160/90 (BP Location: Left Arm)   Pulse 99   Temp 98 F (36.7 C) (Oral)   Resp 16   SpO2 98%   Visual Acuity Right Eye Distance:   Left Eye Distance:   Bilateral Distance:    Right Eye Near:   Left Eye Near:    Bilateral Near:     Physical Exam Vitals and nursing note reviewed.  Constitutional:      General: She is not in acute distress.    Appearance: Normal appearance.  HENT:     Head: Normocephalic.  Eyes:     Extraocular Movements: Extraocular movements intact.     Pupils: Pupils are equal, round, and reactive to light.  Pulmonary:     Effort: Pulmonary effort is normal.  Musculoskeletal:     Left foot: Normal range of motion and normal capillary refill. No swelling or deformity.  Normal pulse.  Comments: Tenderness noted to the dorsal aspect of the left foot. There is no erythema, redness or bruising present.   Skin:    General: Skin is warm and dry.  Neurological:     General: No focal deficit present.     Mental Status: She is alert and oriented to person, place, and time.  Psychiatric:        Mood and Affect: Mood normal.        Behavior: Behavior normal.      UC Treatments / Results  Labs (all labs ordered are listed, but only abnormal results are displayed) Labs Reviewed - No data to display  EKG   Radiology No results found.  Procedures Procedures (including critical care time)  Medications Ordered in UC Medications - No data to display  Initial Impression / Assessment and Plan / UC Course  I have reviewed the triage vital signs and the nursing notes.  Pertinent labs & imaging results that were available during my care of the patient were reviewed by me and considered in my medical decision making (see chart for details).  Suspect patient may be experiencing a flare of possible osteoarthritis of the foot.  Unable to perform imaging at this time, patient does have underlying history of osteoarthritis in the left knee.  Will provide treatment with meloxicam  7.5 mg, and Voltaren  1% gel to apply topically.  Supportive care recommendations were provided and discussed with the patient to include Tylenol  arthritis strength as needed for breakthrough pain, warm Epsom salt soaks, using the Ace wrap provided today to add compression and support of the foot, and wearing shoes with good insoles and support along with RICE therapy.  Patient was advised regarding follow-up.  Patient was in agreement with this plan of care and verbalizes understanding.  All questions were answered.  Patient stable for discharge.  Work note was provided.  Final Clinical Impressions(s) / UC Diagnoses   Final diagnoses:  Left foot pain     Discharge Instructions       Take medication as prescribed. You can take Tylenol  Arthritis Strength if needed for breakthrough pain. Warm epsom salt soaks to help with foot pain or discomfort. ACE wrap was provided today to add compression and support. Wear the wrap when engaged in prolonged or strenuous activities. Apply ice of heat as needed. Apply ice for pain or swelling, heat for spasm or stiffness. Apply for 20 minutes, remove for 1 hr repeat as needed. Wear shoes with good insole and support. RICE therapy, rest, ice, compression and elevation.  Follow-up with PCP if symptoms fail to improve.      ED Prescriptions     Medication Sig Dispense Auth. Provider   meloxicam  (MOBIC ) 7.5 MG tablet Take 1 tablet (7.5 mg total) by mouth daily. 30 tablet Leath-Warren, Etta PARAS, NP   diclofenac  Sodium (VOLTAREN ) 1 % GEL Apply 4 g topically 4 (four) times daily. 150 g Leath-Warren, Etta PARAS, NP      PDMP not reviewed this encounter.   Gilmer Etta PARAS, NP 09/08/23 1410

## 2023-09-08 NOTE — Discharge Instructions (Addendum)
 Take medication as prescribed. You can take Tylenol  Arthritis Strength if needed for breakthrough pain. Warm epsom salt soaks to help with foot pain or discomfort. ACE wrap was provided today to add compression and support. Wear the wrap when engaged in prolonged or strenuous activities. Apply ice of heat as needed. Apply ice for pain or swelling, heat for spasm or stiffness. Apply for 20 minutes, remove for 1 hr repeat as needed. Wear shoes with good insole and support. RICE therapy, rest, ice, compression and elevation.  Follow-up with PCP if symptoms fail to improve.

## 2023-09-22 ENCOUNTER — Other Ambulatory Visit: Payer: Self-pay | Admitting: Family Medicine

## 2023-09-22 DIAGNOSIS — E119 Type 2 diabetes mellitus without complications: Secondary | ICD-10-CM

## 2023-09-28 ENCOUNTER — Other Ambulatory Visit: Payer: Self-pay | Admitting: Family Medicine

## 2023-09-28 DIAGNOSIS — E119 Type 2 diabetes mellitus without complications: Secondary | ICD-10-CM

## 2023-10-22 ENCOUNTER — Other Ambulatory Visit: Payer: Self-pay | Admitting: Family Medicine

## 2023-10-22 DIAGNOSIS — I10 Essential (primary) hypertension: Secondary | ICD-10-CM

## 2023-10-31 DIAGNOSIS — L309 Dermatitis, unspecified: Secondary | ICD-10-CM | POA: Diagnosis not present

## 2023-11-02 ENCOUNTER — Other Ambulatory Visit: Payer: Self-pay | Admitting: Family Medicine

## 2023-11-02 DIAGNOSIS — E119 Type 2 diabetes mellitus without complications: Secondary | ICD-10-CM

## 2023-11-15 ENCOUNTER — Ambulatory Visit (INDEPENDENT_AMBULATORY_CARE_PROVIDER_SITE_OTHER): Payer: Self-pay | Admitting: Family Medicine

## 2023-11-15 ENCOUNTER — Encounter: Payer: Self-pay | Admitting: Family Medicine

## 2023-11-15 VITALS — BP 136/80 | HR 101 | Ht 64.0 in | Wt 246.1 lb

## 2023-11-15 DIAGNOSIS — E1169 Type 2 diabetes mellitus with other specified complication: Secondary | ICD-10-CM | POA: Diagnosis not present

## 2023-11-15 DIAGNOSIS — Z7984 Long term (current) use of oral hypoglycemic drugs: Secondary | ICD-10-CM

## 2023-11-15 DIAGNOSIS — E785 Hyperlipidemia, unspecified: Secondary | ICD-10-CM

## 2023-11-15 DIAGNOSIS — R7301 Impaired fasting glucose: Secondary | ICD-10-CM

## 2023-11-15 DIAGNOSIS — E559 Vitamin D deficiency, unspecified: Secondary | ICD-10-CM

## 2023-11-15 DIAGNOSIS — I10 Essential (primary) hypertension: Secondary | ICD-10-CM | POA: Diagnosis not present

## 2023-11-15 DIAGNOSIS — E1165 Type 2 diabetes mellitus with hyperglycemia: Secondary | ICD-10-CM | POA: Diagnosis not present

## 2023-11-15 DIAGNOSIS — E038 Other specified hypothyroidism: Secondary | ICD-10-CM

## 2023-11-15 DIAGNOSIS — E7849 Other hyperlipidemia: Secondary | ICD-10-CM

## 2023-11-15 DIAGNOSIS — K219 Gastro-esophageal reflux disease without esophagitis: Secondary | ICD-10-CM

## 2023-11-15 MED ORDER — OMEPRAZOLE 20 MG PO CPDR: 20.0000 mg | DELAYED_RELEASE_CAPSULE | Freq: Every day | ORAL | 3 refills | Status: AC

## 2023-11-15 MED ORDER — RYBELSUS 3 MG PO TABS: 3.0000 mg | ORAL_TABLET | Freq: Every day | ORAL | 1 refills | Status: DC

## 2023-11-15 MED ORDER — GLIMEPIRIDE 2 MG PO TABS: 2.0000 mg | ORAL_TABLET | Freq: Every day | ORAL | 3 refills | Status: DC

## 2023-11-15 NOTE — Progress Notes (Unsigned)
 Established Patient Office Visit  Subjective:  Patient ID: Beverly Mccann, female    DOB: [birth year]  Age: 63 y.o. MRN: [id number]  CC:  Chief Complaint  Patient presents with   Care Management    Check up , would like alternative for metformin states it causes diarrhea.     HPI Beverly Mccann is a 63 y.o. female with past medical history of essential hypertension, type II diabetes and hyperlipidemia presents for f/u of  chronic medical conditions. For the details of today's visit, please refer to the assessment and plan.     Past Medical History:  Diagnosis Date   Hypercholesteremia    Hypertension     Past Surgical History:  Procedure Laterality Date   ABDOMINAL HYSTERECTOMY     COLONOSCOPY  02/12/2012   Procedure: COLONOSCOPY;  Surgeon: West Bali, MD;  Location: [facility];  Service: Endoscopy;  Laterality: N/A;  10:30 AM   Cyst removed from right breast      Family History  Problem Relation Age of Onset   Hypertension Mother    Diabetes Mother    Hypertension Father     Social History   Socioeconomic History   Marital status: Married    Spouse name: Not on file   Number of children: Not on file   Years of education: Not on file   Highest education level: Not on file  Occupational History   Not on file  Tobacco Use   Smoking status: Former    Current packs/day: 0.00    Average packs/day: 0.5 packs/day for 15.0 years (7.5 ttl pk-yrs)    Types: Cigarettes    Start date: 11/11/1996    Quit date: 11/12/2011    Years since quitting: 12.0   Smokeless tobacco: Never  Vaping Use   Vaping status: Never Used  Substance and Sexual Activity   Alcohol use: Yes    Comment: occasionally   Drug use: No   Sexual activity: Yes    Birth control/protection: Surgical  Other Topics Concern   Not on file  Social History Narrative   Not on file   Social Drivers of Health   Financial Resource Strain: Not on file  Food Insecurity: Not on file  Transportation  Needs: Not on file  Physical Activity: Not on file  Stress: Not on file  Social Connections: Unknown (01/16/2022)   Received from [facility]   Social Network    Social Network: Not on file  Intimate Partner Violence: Unknown (12/08/2021)   Received from [facility]   HITS    Physically Hurt: Not on file    Insult or Talk Down To: Not on file    Threaten Physical Harm: Not on file    Scream or Curse: Not on file    Outpatient Medications Prior to Visit  Medication Sig Dispense Refill   acetaminophen (TYLENOL) 500 MG tablet Take 500 mg by mouth every 6 (six) hours as needed.     clobetasol ointment (TEMOVATE) 0.05 % Apply 1 Application topically 2 (two) times daily. 120 g 1   diclofenac Sodium (VOLTAREN) 1 % GEL Apply 4 g topically 4 (four) times daily. 150 g 0   FARXIGA 5 MG TABS tablet TAKE 1 TABLET BY MOUTH ONCE DAILY BEFORE BREAKFAST 30 tablet 0   hydrOXYzine (ATARAX) 25 MG tablet Take 1 tablet (25 mg total) by mouth every 8 (eight) hours as needed for itching. 12 tablet 0   levocetirizine (  XYZAL) 5 MG tablet Take 1 tablet (5 mg total) by mouth every evening. 30 tablet 0   meloxicam (MOBIC) 7.5 MG tablet Take 1 tablet (7.5 mg total) by mouth daily. 30 tablet 0   nicotine (NICODERM CQ) 21 mg/24hr patch Place 1 patch (21 mg total) onto the skin daily. 28 patch 0   Olmesartan-amLODIPine-HCTZ 40-5-25 MG TABS Take 1 tablet by mouth once daily 90 tablet 0   predniSONE (DELTASONE) 50 MG tablet Take one tablet with breakfast for the next 5 days. 5 tablet 0   rosuvastatin (CRESTOR) 10 MG tablet Take 1 tablet (10 mg total) by mouth daily. 90 tablet 3   triamcinolone cream (KENALOG) 0.1 % APPLY CREAM TOPICALLY TWICE DAILY 15 g 0   Vitamin D, Ergocalciferol, (DRISDOL) 1.25 MG (50000 UNIT) CAPS capsule Take 1 capsule (50,000 Units total) by mouth once a week. 20 capsule 1   metFORMIN (GLUCOPHAGE) 500 MG tablet TAKE 1 TABLET BY MOUTH TWICE DAILY WITH A MEAL 60  tablet 0   No facility-administered medications prior to visit.    Allergies  Allergen Reactions   Clindamycin/Lincomycin Rash    ROS Review of Systems  Constitutional:  Negative for chills and fever.  Eyes:  Negative for visual disturbance.  Respiratory:  Negative for chest tightness and shortness of breath.   Neurological:  Negative for dizziness and headaches.      Objective:    Physical Exam HENT:     Head: Normocephalic.     Mouth/Throat:     Mouth: Mucous membranes are moist.  Cardiovascular:     Rate and Rhythm: Normal rate.     Heart sounds: Normal heart sounds.  Pulmonary:     Effort: Pulmonary effort is normal.     Breath sounds: Normal breath sounds.  Neurological:     Mental Status: She is alert.     BP 136/80   Pulse (!) 101   Ht 5\' 4"  (1.626 m)   Wt 246 lb 1.9 oz (111.6 kg)   SpO2 96%   BMI 42.25 kg/m  Wt Readings from Last 3 Encounters:  11/15/23 246 lb 1.9 oz (111.6 kg)  05/04/23 248 lb 1.3 oz (112.5 kg)  02/01/23 250 lb (113.4 kg)    Lab Results  Component Value Date   TSH 0.809 05/25/2023   Lab Results  Component Value Date   WBC 8.1 05/25/2023   HGB 11.3 05/25/2023   HCT 35.9 05/25/2023   MCV 84 05/25/2023   PLT 257 05/25/2023   Lab Results  Component Value Date   NA 141 05/25/2023   K 3.8 05/25/2023   CO2 24 05/25/2023   GLUCOSE 126 (H) 05/25/2023   BUN 17 05/25/2023   CREATININE 0.80 05/25/2023   BILITOT 0.4 05/25/2023   ALKPHOS 75 05/25/2023   AST 20 05/25/2023   ALT 23 05/25/2023   PROT 7.3 05/25/2023   ALBUMIN 4.5 05/25/2023   CALCIUM 10.0 05/25/2023   ANIONGAP 12 08/10/2021   EGFR 83 05/25/2023   Lab Results  Component Value Date   CHOL 202 (H) 05/25/2023   Lab Results  Component Value Date   HDL 72 05/25/2023   Lab Results  Component Value Date   LDLCALC 111 (H) 05/25/2023   Lab Results  Component Value Date   TRIG 110 05/25/2023   Lab Results  Component Value Date   CHOLHDL 2.8 05/25/2023    Lab Results  Component Value Date   HGBA1C 7.3 (H) 05/25/2023  Assessment & Plan:  Essential hypertension Assessment & Plan: Controlled She takes olmesartan-amlodipine-hydrochlorothiazide 40-5-25 daily Asymptomatic today in the clinic Encouraged low-sodium diet with increased physical activity BP Readings from Last 3 Encounters:  11/15/23 136/80  09/08/23 (!) 160/90  05/04/23 137/80      Hyperlipidemia associated with type 2 diabetes mellitus (HCC) Assessment & Plan: The patient was encouraged to continue taking Rosuvastatin 10 mg daily for cholesterol management. Lifestyle modifications were also discussed, including avoiding simple carbohydrates such as cakes, sweet desserts, ice cream, soda (diet or regular), sweet tea, candies, chips, cookies, store-bought juices, excessive alcohol (more than 1-2 drinks per day), lemonade, artificial sweeteners, donuts, coffee creamers, and sugar-free products. Additionally, the patient was advised to reduce the consumption of greasy, fatty foods and increase physical activity to support cardiovascular health. The patient verbalized understanding and is aware of the plan of care.   Orders: -     Lipid panel -     CMP14+EGFR -     CBC with Differential/Platelet  Type 2 diabetes mellitus with hyperglycemia, without long-term current use of insulin (HCC) Assessment & Plan: The patient complains of increased diarrhea with Metformin. Will discontinue Metformin and initiate therapy with Rybelsus 3 mg daily and Glimepiride 2 mg daily. I recommended reducing his intake of high-sugar foods and beverages and increasing physical activity to 150 minutes of moderate-intensity exercise per week, as tolerated.   Orders: -     Rybelsus; Take 1 tablet (3 mg total) by mouth daily.  Dispense: 30 tablet; Refill: 1 -     Glimepiride; Take 1 tablet (2 mg total) by mouth daily before breakfast.  Dispense: 30 tablet; Refill: 3  Gastroesophageal reflux  disease without esophagitis Assessment & Plan: Encouraged to start taking omeprazole 20 mg daily GERD diet encouraged  Orders: -     Omeprazole; Take 1 capsule (20 mg total) by mouth daily.  Dispense: 30 capsule; Refill: 3  IFG (impaired fasting glucose) -     Hemoglobin A1c  Vitamin D deficiency -     VITAMIN D 25 Hydroxy (Vit-D Deficiency, Fractures)  TSH (thyroid-stimulating hormone deficiency) -     TSH + free T4  Note: This chart has been completed using Engineer, civil (consulting) software, and while attempts have been made to ensure accuracy, certain words and phrases may not be transcribed as intended.    Follow-up: Return in about 4 months (around 03/16/2024).   Gilmore Laroche, FNP

## 2023-11-15 NOTE — Patient Instructions (Addendum)
 I appreciate the opportunity to provide care to you today!    Follow up: 4  months  Fasting Labs: please stop by the lab during the week to get your blood drawn (CBC, CMP, TSH, Lipid profile, HgA1c, Vit D)  Type 2 Diabetes Start taking Rybelsus 3 mg daily and Glimepiride 2 mg daily. I recommend decreasing your intake of high-sugar foods and beverages and increasing physical activity.  GERD Start taking Omeprazole 20 mg daily. I recommend the following lifestyle changes: Avoid Certain Foods and Drinks: Limit or eliminate coffee, chocolate, onions, peppermint, spicy foods, carbonated beverages, citrus fruits, tomatoes, garlic, alcohol, and fatty foods such as bacon, burgers, sausages, steak, fried foods, and dairy products.  Recommended Foods: Increase your intake of high-fiber foods including whole grain cereals, oatmeal, brown rice, root vegetables, and non-citrus fruits. Opt for high-protein foods and healthy fats such as avocados, olive oil, nuts, and seeds.     Please continue to a heart-healthy diet and increase your physical activities. Try to exercise for at least five days a week.    It was a pleasure to see you and I look forward to continuing to work together on your health and well-being. Please do not hesitate to call the office if you need care or have questions about your care.  In case of emergency, please visit the Emergency Department for urgent care, or contact our clinic at [phone] to schedule an appointment. We're here to help you!   Have a wonderful day and week. With Gratitude, Gilmore Laroche MSN, FNP-BC

## 2023-11-16 DIAGNOSIS — K219 Gastro-esophageal reflux disease without esophagitis: Secondary | ICD-10-CM | POA: Insufficient documentation

## 2023-11-16 NOTE — Assessment & Plan Note (Signed)
 The patient was encouraged to continue taking Rosuvastatin 10 mg daily for cholesterol management. Lifestyle modifications were also discussed, including avoiding simple carbohydrates such as cakes, sweet desserts, ice cream, soda (diet or regular), sweet tea, candies, chips, cookies, store-bought juices, excessive alcohol (more than 1-2 drinks per day), lemonade, artificial sweeteners, donuts, coffee creamers, and sugar-free products. Additionally, the patient was advised to reduce the consumption of greasy, fatty foods and increase physical activity to support cardiovascular health. The patient verbalized understanding and is aware of the plan of care.

## 2023-11-16 NOTE — Assessment & Plan Note (Signed)
 Encouraged to start taking omeprazole 20 mg daily GERD diet encouraged

## 2023-11-16 NOTE — Assessment & Plan Note (Signed)
 Controlled She takes olmesartan-amlodipine-hydrochlorothiazide 40-5-25 daily Asymptomatic today in the clinic Encouraged low-sodium diet with increased physical activity BP Readings from Last 3 Encounters:  11/15/23 136/80  09/08/23 (!) 160/90  05/04/23 137/80

## 2023-11-16 NOTE — Assessment & Plan Note (Signed)
 The patient complains of increased diarrhea with Metformin. Will discontinue Metformin and initiate therapy with Rybelsus 3 mg daily and Glimepiride 2 mg daily. I recommended reducing his intake of high-sugar foods and beverages and increasing physical activity to 150 minutes of moderate-intensity exercise per week, as tolerated.

## 2023-11-28 DIAGNOSIS — E7849 Other hyperlipidemia: Secondary | ICD-10-CM | POA: Diagnosis not present

## 2023-11-28 DIAGNOSIS — R7301 Impaired fasting glucose: Secondary | ICD-10-CM | POA: Diagnosis not present

## 2023-11-28 DIAGNOSIS — E038 Other specified hypothyroidism: Secondary | ICD-10-CM | POA: Diagnosis not present

## 2023-11-28 DIAGNOSIS — E559 Vitamin D deficiency, unspecified: Secondary | ICD-10-CM | POA: Diagnosis not present

## 2023-11-29 LAB — LIPID PANEL
Chol/HDL Ratio: 2.7 ratio (ref 0.0–4.4)
Cholesterol, Total: 226 mg/dL — ABNORMAL HIGH (ref 100–199)
HDL: 85 mg/dL (ref >39)
LDL Chol Calc (NIH): 123 mg/dL — ABNORMAL HIGH (ref 0–99)
Triglycerides: 105 mg/dL (ref 0–149)
VLDL Cholesterol Cal: 18 mg/dL (ref 5–40)

## 2023-11-29 LAB — CBC WITH DIFFERENTIAL/PLATELET
Basophils Absolute: 0.1 10*3/uL (ref 0.0–0.2)
Basos: 1 %
EOS (ABSOLUTE): 0.2 10*3/uL (ref 0.0–0.4)
Eos: 3 %
Hematocrit: 36.4 % (ref 34.0–46.6)
Hemoglobin: 11.6 g/dL (ref 11.1–15.9)
Immature Grans (Abs): 0 10*3/uL (ref 0.0–0.1)
Immature Granulocytes: 0 %
Lymphocytes Absolute: 4.2 10*3/uL — ABNORMAL HIGH (ref 0.7–3.1)
Lymphs: 47 %
MCH: 26 pg — ABNORMAL LOW (ref 26.6–33.0)
MCHC: 31.9 g/dL (ref 31.5–35.7)
MCV: 81 fL (ref 79–97)
Monocytes Absolute: 0.5 10*3/uL (ref 0.1–0.9)
Monocytes: 6 %
Neutrophils Absolute: 3.8 10*3/uL (ref 1.4–7.0)
Neutrophils: 43 %
Platelets: 237 10*3/uL (ref 150–450)
RBC: 4.47 x10E6/uL (ref 3.77–5.28)
RDW: 14.1 % (ref 11.7–15.4)
WBC: 8.8 10*3/uL (ref 3.4–10.8)

## 2023-11-29 LAB — CMP14+EGFR
ALT: 18 IU/L (ref 0–32)
AST: 15 IU/L (ref 0–40)
Albumin: 4.7 g/dL (ref 3.9–4.9)
Alkaline Phosphatase: 76 IU/L (ref 44–121)
BUN/Creatinine Ratio: 33 — ABNORMAL HIGH (ref 12–28)
BUN: 24 mg/dL (ref 8–27)
Bilirubin Total: 0.5 mg/dL (ref 0.0–1.2)
CO2: 24 mmol/L (ref 20–29)
Calcium: 10.1 mg/dL (ref 8.7–10.3)
Chloride: 101 mmol/L (ref 96–106)
Creatinine, Ser: 0.73 mg/dL (ref 0.57–1.00)
Globulin, Total: 3 g/dL (ref 1.5–4.5)
Glucose: 114 mg/dL — ABNORMAL HIGH (ref 70–99)
Potassium: 3.8 mmol/L (ref 3.5–5.2)
Sodium: 142 mmol/L (ref 134–144)
Total Protein: 7.7 g/dL (ref 6.0–8.5)
eGFR: 92 mL/min/{1.73_m2} (ref >59)

## 2023-11-29 LAB — HEMOGLOBIN A1C
Est. average glucose Bld gHb Est-mCnc: 171 mg/dL
Hgb A1c MFr Bld: 7.6 % — ABNORMAL HIGH (ref 4.8–5.6)

## 2023-11-29 LAB — TSH+FREE T4
Free T4: 1.22 ng/dL (ref 0.82–1.77)
TSH: 1.21 u[IU]/mL (ref 0.450–4.500)

## 2023-11-29 LAB — VITAMIN D 25 HYDROXY (VIT D DEFICIENCY, FRACTURES): Vit D, 25-Hydroxy: 31.6 ng/mL (ref 30.0–100.0)

## 2023-12-01 ENCOUNTER — Encounter: Payer: Self-pay | Admitting: Emergency Medicine

## 2023-12-01 ENCOUNTER — Other Ambulatory Visit: Payer: Self-pay | Admitting: Family Medicine

## 2023-12-01 ENCOUNTER — Ambulatory Visit
Admission: EM | Admit: 2023-12-01 | Discharge: 2023-12-01 | Disposition: A | Discharge status: Home / Self Care | Attending: Nurse Practitioner | Admitting: Nurse Practitioner

## 2023-12-01 DIAGNOSIS — E1165 Type 2 diabetes mellitus with hyperglycemia: Secondary | ICD-10-CM

## 2023-12-01 DIAGNOSIS — J069 Acute upper respiratory infection, unspecified: Secondary | ICD-10-CM

## 2023-12-01 LAB — POC COVID19/FLU A&B COMBO
Covid Antigen, POC: NEGATIVE
Influenza A Antigen, POC: NEGATIVE
Influenza B Antigen, POC: NEGATIVE

## 2023-12-01 MED ORDER — GLIMEPIRIDE 4 MG PO TABS: 4.0000 mg | ORAL_TABLET | Freq: Every day | ORAL | 3 refills | Status: DC

## 2023-12-01 MED ORDER — RYBELSUS 7 MG PO TABS: 7.0000 mg | ORAL_TABLET | Freq: Every day | ORAL | 1 refills | Status: DC

## 2023-12-01 MED ORDER — CETIRIZINE HCL 10 MG PO TABS: 10.0000 mg | ORAL_TABLET | Freq: Every day | ORAL | 0 refills | Status: AC

## 2023-12-01 MED ORDER — DAPAGLIFLOZIN PROPANEDIOL 10 MG PO TABS
10.0000 mg | ORAL_TABLET | Freq: Every day | ORAL | 1 refills | Status: DC
Start: 2023-12-01 — End: 2024-04-28

## 2023-12-01 MED ORDER — FLUTICASONE PROPIONATE 50 MCG/ACT NA SUSP
2.0000 | Freq: Every day | NASAL | 0 refills | Status: AC
Start: 2023-12-01 — End: ?

## 2023-12-01 NOTE — ED Triage Notes (Signed)
 Cough, sneezing, runny nose, chills, sore throat since Friday.

## 2023-12-01 NOTE — ED Provider Notes (Signed)
 [facility]    CSN: [id number] Arrival date & time: 12/01/23  1548      History   Chief Complaint No chief complaint on file.   HPI Beverly Mccann is a 63 y.o. female.   The history is provided by the patient.   Patient presents with a 2-day history of headache, sneezing, nasal congestion, runny nose, and cough.  Patient denies fever, ear pain, ear drainage, wheezing, difficulty breathing, chest pain, abdominal pain, nausea, vomiting, diarrhea, or rash.  Patient states that she has been taking over-the-counter cough and cold medications for her symptoms along with cough drops.  States that she was next to a coworker who said he had allergies.  Also states that she sleeps under a fan. 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   Hyperlipidemia associated with type 2 diabetes mellitus (HCC) 08/18/2006   OBESITY NOS 08/18/2006   Essential hypertension 08/18/2006   ECZEMA 08/18/2006   Osteoarthritis 08/18/2006   Other specified abnormal findings of blood chemistry 08/18/2006    Past Surgical History:  Procedure Laterality Date   ABDOMINAL HYSTERECTOMY     COLONOSCOPY  02/12/2012   Procedure: COLONOSCOPY;  Surgeon: West Bali, MD;  Location: [facility];  Service: Endoscopy;  Laterality: N/A;  10:30 AM   Cyst removed from right breast      OB History     Gravida  3   Para  3   Term  3   Preterm      AB      Living   3      SAB      IAB      Ectopic      Multiple      Live Births               Home Medications    Prior to Admission medications   Medication Sig Start Date End Date Taking? Authorizing Provider  acetaminophen (TYLENOL) 500 MG tablet Take 500 mg by mouth every 6 (six) hours as needed.    [provider]  clobetasol ointment (TEMOVATE) 0.05 % Apply 1 Application topically 2 (two) times daily. 01/06/23   Mardella Layman, MD  diclofenac Sodium (VOLTAREN) 1 % GEL Apply 4 g topically 4 (four) times daily. 09/08/23   Leath-Warren, Sadie Haber, NP  FARXIGA 5 MG TABS tablet TAKE 1 TABLET BY MOUTH ONCE DAILY BEFORE BREAKFAST 11/02/23   Gilmore Laroche, FNP  glimepiride (AMARYL) 2 MG tablet Take 1 tablet (2 mg total) by mouth daily before breakfast. 11/15/23   Gilmore Laroche, FNP  hydrOXYzine (ATARAX) 25 MG tablet Take 1 tablet (25 mg total) by mouth every 8 (eight) hours as needed for itching. 12/28/22   Marita Kansas, PA-C  levocetirizine (XYZAL) 5 MG tablet Take 1 tablet (5 mg total) by mouth every evening. 12/20/22   Del Nigel Berthold, FNP  meloxicam (MOBIC) 7.5 MG tablet Take 1 tablet (7.5 mg  total) by mouth daily. 09/08/23   Leath-Warren, Sadie Haber, NP  nicotine (NICODERM CQ) 21 mg/24hr patch Place 1 patch (21 mg total) onto the skin daily. 12/20/22   Del Newman Nip, Tenna Child, FNP  Olmesartan-amLODIPine-HCTZ 40-5-25 MG TABS Take 1 tablet by mouth once daily 10/22/23   Gilmore Laroche, FNP  omeprazole (PRILOSEC) 20 MG capsule Take 1 capsule (20 mg total) by mouth daily. 11/15/23   Gilmore Laroche, FNP  rosuvastatin (CRESTOR) 10 MG tablet Take 1 tablet (10 mg total) by mouth daily. 05/29/23   Gilmore Laroche, FNP  Semaglutide (RYBELSUS) 3 MG TABS Take 1 tablet (3 mg total) by mouth daily. 11/15/23   Gilmore Laroche, FNP  triamcinolone cream (KENALOG) 0.1 % APPLY CREAM TOPICALLY TWICE DAILY 01/05/23   Gilmore Laroche, FNP  Vitamin D, Ergocalciferol, (DRISDOL) 1.25 MG (50000 UNIT) CAPS  capsule Take 1 capsule (50,000 Units total) by mouth once a week. 05/29/23   Gilmore Laroche, FNP    Family History Family History  Problem Relation Age of Onset   Hypertension Mother    Diabetes Mother    Hypertension Father     Social History Social History   Tobacco Use   Smoking status: Former    Current packs/day: 0.00    Average packs/day: 0.5 packs/day for 15.0 years (7.5 ttl pk-yrs)    Types: Cigarettes    Start date: 11/11/1996    Quit date: 11/12/2011    Years since quitting: 12.0   Smokeless tobacco: Never  Vaping Use   Vaping status: Never Used  Substance Use Topics   Alcohol use: Yes    Comment: occasionally   Drug use: No     Allergies   Clindamycin/lincomycin   Review of Systems Review of Systems Per HPI  Physical Exam Triage Vital Signs ED Triage Vitals  Encounter Vitals Group     BP 12/01/23 1554 (!) 190/71     Systolic BP Percentile --      Diastolic BP Percentile --      Pulse Rate 12/01/23 1554 92     Resp 12/01/23 1554 18     Temp 12/01/23 1554 98.8 F (37.1 C)     Temp Source 12/01/23 1554 Oral     SpO2 12/01/23 1554 95 %     Weight --      Height --      Head Circumference --      Peak Flow --      Pain Score 12/01/23 1555 0     Pain Loc --      Pain Education --      Exclude from Growth Chart --    No data found.  Updated Vital Signs BP (!) 190/71 (BP Location: Right Arm)   Pulse 92   Temp 98.8 F (37.1 C) (Oral)   Resp 18   SpO2 95%   Visual Acuity Right Eye Distance:   Left Eye Distance:   Bilateral Distance:    Right Eye Near:   Left Eye Near:    Bilateral Near:     Physical Exam Vitals and nursing note reviewed.  Constitutional:      General: She is not in acute distress.    Appearance: Normal appearance.  HENT:     Head: Normocephalic.     Right Ear: Tympanic membrane, ear canal and external ear normal.     Left Ear: Tympanic membrane, ear canal and external ear normal.     Nose: Congestion present.      Mouth/Throat:  Mouth: Mucous membranes are moist.  Eyes:     Extraocular Movements: Extraocular movements intact.     Conjunctiva/sclera: Conjunctivae normal.     Pupils: Pupils are equal, round, and reactive to light.  Cardiovascular:     Rate and Rhythm: Normal rate and regular rhythm.     Pulses: Normal pulses.     Heart sounds: Normal heart sounds.  Pulmonary:     Effort: Pulmonary effort is normal. No respiratory distress.     Breath sounds: Normal breath sounds. No stridor. No wheezing, rhonchi or rales.  Abdominal:     General: Bowel sounds are normal.     Palpations: Abdomen is soft.     Tenderness: There is no abdominal tenderness.  Musculoskeletal:     Cervical back: Normal range of motion.  Skin:    General: Skin is warm and dry.  Neurological:     General: No focal deficit present.     Mental Status: She is alert and oriented to person, place, and time.  Psychiatric:        Mood and Affect: Mood normal.        Behavior: Behavior normal.      UC Treatments / Results  Labs (all labs ordered are listed, but only abnormal results are displayed) Labs Reviewed  POC COVID19/FLU A&B COMBO    EKG   Radiology No results found.  Procedures Procedures (including critical care time)  Medications Ordered in UC Medications - No data to display  Initial Impression / Assessment and Plan / UC Course  I have reviewed the triage vital signs and the nursing notes.  Pertinent labs & imaging results that were available during my care of the patient were reviewed by me and considered in my medical decision making (see chart for details).  COVID/flu test was negative.  Symptoms consistent with a viral URI with cough.  Will provide symptomatic treatment with cetirizine 10 mg as an antihistamine and fluticasone 50 mcg nasal spray for nasal congestion and runny nose.  Patient advised to continue over-the-counter Coricidin for her cough.  Supportive care recommendations  were provided and discussed with the patient to include fluids, rest, and over-the-counter analgesics.  Discussed indications regarding follow-up.  Patient was in agreement with this plan of care and verbalized understanding.  All questions were answered.  Patient stable for discharge.  Final Clinical Impressions(s) / UC Diagnoses   Final diagnoses:  None   Discharge Instructions   None    ED Prescriptions   None    PDMP not reviewed this encounter.   Abran Cantor, NP 12/01/23 [phone]

## 2023-12-01 NOTE — Progress Notes (Signed)
 Hello Beverly Mccann Your HbA1c has increased to 7.6. Therefore, I've increased your Glimepiride to 4 mg daily and your Farxiga to 10 mg daily. I've also increased your Rybelsus to 7 mg daily. Please complete your current prescriptions and pick up the new ones at the pharmacy. Your cholesterol levels are elevated, and I want you to start taking Rosuvastatin 20 mg daily - that is, two tablets of the 10 mg.

## 2023-12-01 NOTE — Discharge Instructions (Signed)
 The COVID/flu test was negative. Take medication as prescribed.  Continue over-the-counter Coricidin for your cough. Increase fluids and allow for plenty of rest. May take over-the-counter Tylenol as needed for pain, fever, or general discomfort. Recommend the use of normal saline nasal spray throughout the day for nasal congestion and runny nose. For your cough, recommend use of a humidifier in your bedroom at nighttime during sleep and sleeping elevated on pillows while symptoms persist. If symptoms fail to improve over the next several days, or appear to be worsening, you may follow-up in this clinic or with your primary care physician for further evaluation. Follow-up as needed.

## 2024-01-26 ENCOUNTER — Other Ambulatory Visit: Payer: Self-pay | Admitting: Family Medicine

## 2024-01-26 DIAGNOSIS — I10 Essential (primary) hypertension: Secondary | ICD-10-CM

## 2024-01-29 DIAGNOSIS — M25471 Effusion, right ankle: Secondary | ICD-10-CM | POA: Diagnosis not present

## 2024-01-29 DIAGNOSIS — M79671 Pain in right foot: Secondary | ICD-10-CM | POA: Diagnosis not present

## 2024-01-29 DIAGNOSIS — M25571 Pain in right ankle and joints of right foot: Secondary | ICD-10-CM | POA: Diagnosis not present

## 2024-01-30 DIAGNOSIS — M25471 Effusion, right ankle: Secondary | ICD-10-CM | POA: Diagnosis not present

## 2024-02-11 DIAGNOSIS — S93491A Sprain of other ligament of right ankle, initial encounter: Secondary | ICD-10-CM | POA: Diagnosis not present

## 2024-02-11 DIAGNOSIS — M7671 Peroneal tendinitis, right leg: Secondary | ICD-10-CM | POA: Diagnosis not present

## 2024-02-21 ENCOUNTER — Ambulatory Visit

## 2024-02-21 DIAGNOSIS — E1165 Type 2 diabetes mellitus with hyperglycemia: Secondary | ICD-10-CM | POA: Diagnosis not present

## 2024-02-21 DIAGNOSIS — Z794 Long term (current) use of insulin: Secondary | ICD-10-CM

## 2024-02-21 DIAGNOSIS — E119 Type 2 diabetes mellitus without complications: Secondary | ICD-10-CM

## 2024-02-21 LAB — HM DIABETES EYE EXAM

## 2024-02-21 NOTE — Progress Notes (Signed)
 Arrived 02/21/2024 and has given verbal consent to obtain images and complete their overdue diabetic retinal screening.  The images have been sent to an ophthalmologist or optometrist for review and interpretation.  Results will be sent back to @PCP @ for review.  Patient has been informed they will be contacted when we receive the results via telephone or MyChart.  NOTE: Was successful in getting good image of RT eye but not the LT eye. Pupils were borderline too small.

## 2024-02-26 DIAGNOSIS — M25571 Pain in right ankle and joints of right foot: Secondary | ICD-10-CM | POA: Diagnosis not present

## 2024-03-05 DIAGNOSIS — M25571 Pain in right ankle and joints of right foot: Secondary | ICD-10-CM | POA: Diagnosis not present

## 2024-03-12 DIAGNOSIS — M25571 Pain in right ankle and joints of right foot: Secondary | ICD-10-CM | POA: Diagnosis not present

## 2024-03-17 ENCOUNTER — Ambulatory Visit: Admitting: Family Medicine

## 2024-03-19 DIAGNOSIS — M25571 Pain in right ankle and joints of right foot: Secondary | ICD-10-CM | POA: Diagnosis not present

## 2024-03-28 DIAGNOSIS — M25571 Pain in right ankle and joints of right foot: Secondary | ICD-10-CM | POA: Diagnosis not present

## 2024-04-28 ENCOUNTER — Encounter: Payer: Self-pay | Admitting: Family Medicine

## 2024-04-28 ENCOUNTER — Ambulatory Visit (INDEPENDENT_AMBULATORY_CARE_PROVIDER_SITE_OTHER): Payer: Self-pay | Admitting: Family Medicine

## 2024-04-28 VITALS — BP 138/82 | HR 87 | Resp 16 | Ht 64.0 in | Wt 238.0 lb

## 2024-04-28 DIAGNOSIS — E1165 Type 2 diabetes mellitus with hyperglycemia: Secondary | ICD-10-CM

## 2024-04-28 DIAGNOSIS — E038 Other specified hypothyroidism: Secondary | ICD-10-CM

## 2024-04-28 DIAGNOSIS — E785 Hyperlipidemia, unspecified: Secondary | ICD-10-CM | POA: Diagnosis not present

## 2024-04-28 DIAGNOSIS — I10 Essential (primary) hypertension: Secondary | ICD-10-CM | POA: Diagnosis not present

## 2024-04-28 DIAGNOSIS — E1169 Type 2 diabetes mellitus with other specified complication: Secondary | ICD-10-CM

## 2024-04-28 DIAGNOSIS — E559 Vitamin D deficiency, unspecified: Secondary | ICD-10-CM

## 2024-04-28 MED ORDER — ROSUVASTATIN CALCIUM 10 MG PO TABS: 10.0000 mg | ORAL_TABLET | Freq: Every day | ORAL | 3 refills | Status: DC

## 2024-04-28 MED ORDER — TIRZEPATIDE 2.5 MG/0.5ML ~~LOC~~ SOAJ: 2.5000 mg | SUBCUTANEOUS | 0 refills | Status: AC

## 2024-04-28 MED ORDER — OLMESARTAN-AMLODIPINE-HCTZ 40-5-25 MG PO TABS: 1.0000 | ORAL_TABLET | Freq: Every day | ORAL | 0 refills | Status: DC

## 2024-04-28 MED ORDER — GLIMEPIRIDE 4 MG PO TABS: 4.0000 mg | ORAL_TABLET | Freq: Every day | ORAL | 3 refills | Status: DC

## 2024-04-28 MED ORDER — DAPAGLIFLOZIN PROPANEDIOL 10 MG PO TABS: 10.0000 mg | ORAL_TABLET | Freq: Every day | ORAL | 1 refills | Status: AC

## 2024-04-28 NOTE — Progress Notes (Signed)
 Established Patient Office Visit  Subjective:  Patient ID: Beverly Mccann, female    DOB: 1960/11/18  Age: 63 y.o. MRN: [id number]  CC:  Chief Complaint  Patient presents with   Obesity    Wants to discuss weight loss injection     HPI Beverly Mccann is a 63 y.o. female with past medical history of  Type 2 diabetes, HTN, hyperlipidemia presents for f/u of chronic medical conditions.  For the details of today's visit, please refer to the assessment and plan.     Past Medical History:  Diagnosis Date   Hypercholesteremia    Hypertension     Past Surgical History:  Procedure Laterality Date   ABDOMINAL HYSTERECTOMY     COLONOSCOPY  02/12/2012   Procedure: COLONOSCOPY;  Surgeon: Margo LITTIE Haddock, MD;  Location: [facility];  Service: Endoscopy;  Laterality: N/A;  10:30 AM   Cyst removed from right breast      Family History  Problem Relation Age of Onset   Hypertension Mother    Diabetes Mother    Hypertension Father     Social History   Socioeconomic History   Marital status: Married    Spouse name: Not on file   Number of children: Not on file   Years of education: Not on file   Highest education level: Not on file  Occupational History   Not on file  Tobacco Use   Smoking status: Former    Current packs/day: 0.00    Average packs/day: 0.5 packs/day for 15.0 years (7.5 ttl pk-yrs)    Types: Cigarettes    Start date: 11/11/1996    Quit date: 11/12/2011    Years since quitting: 12.4   Smokeless tobacco: Never  Vaping Use   Vaping status: Never Used  Substance and Sexual Activity   Alcohol use: Yes    Comment: occasionally   Drug use: No   Sexual activity: Yes    Birth control/protection: Surgical  Other Topics Concern   Not on file  Social History Narrative   Not on file   Social Drivers of Health   Financial Resource Strain: Not on file  Food Insecurity: Not on file  Transportation Needs: Not on file  Physical Activity: Not on file  Stress: Not on  file  Social Connections: Unknown (01/16/2022)   Received from [facility]   Social Network    Social Network: Not on file  Intimate Partner Violence: Unknown (12/08/2021)   Received from [facility]   HITS    Physically Hurt: Not on file    Insult or Talk Down To: Not on file    Threaten Physical Harm: Not on file    Scream or Curse: Not on file    Outpatient Medications Prior to Visit  Medication Sig Dispense Refill   acetaminophen  (TYLENOL ) 500 MG tablet Take 500 mg by mouth every 6 (six) hours as needed.     cetirizine  (ZYRTEC ) 10 MG tablet Take 1 tablet (10 mg total) by mouth daily. 30 tablet 0   clobetasol  ointment (TEMOVATE ) 0.05 % Apply 1 Application topically 2 (two) times daily. 120 g 1   diclofenac  Sodium (VOLTAREN ) 1 % GEL Apply 4 g topically 4 (four) times daily. 150 g 0   fluticasone  (FLONASE ) 50 MCG/ACT nasal spray Place 2 sprays into both nostrils daily. 16 g 0   hydrOXYzine  (ATARAX ) 25 MG tablet Take 1 tablet (25 mg total) by mouth every 8 (eight) hours as needed for itching.  12 tablet 0   levocetirizine (XYZAL ) 5 MG tablet Take 1 tablet (5 mg total) by mouth every evening. 30 tablet 0   meloxicam  (MOBIC ) 7.5 MG tablet Take 1 tablet (7.5 mg total) by mouth daily. 30 tablet 0   nicotine  (NICODERM CQ ) 21 mg/24hr patch Place 1 patch (21 mg total) onto the skin daily. 28 patch 0   omeprazole  (PRILOSEC) 20 MG capsule Take 1 capsule (20 mg total) by mouth daily. 30 capsule 3   Semaglutide  (RYBELSUS ) 7 MG TABS Take 1 tablet (7 mg total) by mouth daily. 60 tablet 1   triamcinolone  cream (KENALOG ) 0.1 % APPLY CREAM TOPICALLY TWICE DAILY 15 g 0   Vitamin D , Ergocalciferol , (DRISDOL ) 1.25 MG (50000 UNIT) CAPS capsule Take 1 capsule (50,000 Units total) by mouth once a week. 20 capsule 1   dapagliflozin  propanediol (FARXIGA ) 10 MG TABS tablet Take 1 tablet (10 mg total) by mouth daily before breakfast. 60 tablet 1   glimepiride  (AMARYL ) 4 MG tablet Take 1 tablet (4 mg total) by  mouth daily before breakfast. 30 tablet 3   Olmesartan -amLODIPine -HCTZ 40-5-25 MG TABS Take 1 tablet by mouth once daily 90 tablet 0   rosuvastatin  (CRESTOR ) 10 MG tablet Take 1 tablet (10 mg total) by mouth daily. 90 tablet 3   No facility-administered medications prior to visit.    Allergies  Allergen Reactions   Clindamycin/Lincomycin Rash    ROS Review of Systems  Constitutional:  Negative for chills and fever.  Eyes:  Negative for visual disturbance.  Respiratory:  Negative for chest tightness and shortness of breath.   Neurological:  Negative for dizziness and headaches.      Objective:    Physical Exam Constitutional:      Appearance: She is obese.  HENT:     Head: Normocephalic.     Mouth/Throat:     Mouth: Mucous membranes are moist.  Cardiovascular:     Rate and Rhythm: Normal rate.     Heart sounds: Normal heart sounds.  Pulmonary:     Effort: Pulmonary effort is normal.     Breath sounds: Normal breath sounds.  Neurological:     Mental Status: She is alert.     BP 138/82   Pulse 87   Resp 16   Ht 5' 4 (1.626 m)   Wt 238 lb (108 kg)   SpO2 94%   BMI 40.85 kg/m  Wt Readings from Last 3 Encounters:  04/28/24 238 lb (108 kg)  11/15/23 246 lb 1.9 oz (111.6 kg)  05/04/23 248 lb 1.3 oz (112.5 kg)    Lab Results  Component Value Date   TSH 1.210 11/28/2023   Lab Results  Component Value Date   WBC 8.8 11/28/2023   HGB 11.6 11/28/2023   HCT 36.4 11/28/2023   MCV 81 11/28/2023   PLT 237 11/28/2023   Lab Results  Component Value Date   NA 142 11/28/2023   K 3.8 11/28/2023   CO2 24 11/28/2023   GLUCOSE 114 (H) 11/28/2023   BUN 24 11/28/2023   CREATININE 0.73 11/28/2023   BILITOT 0.5 11/28/2023   ALKPHOS 76 11/28/2023   AST 15 11/28/2023   ALT 18 11/28/2023   PROT 7.7 11/28/2023   ALBUMIN 4.7 11/28/2023   CALCIUM  10.1 11/28/2023   ANIONGAP 12 08/10/2021   EGFR 92 11/28/2023   Lab Results  Component Value Date   CHOL 226 (H)  11/28/2023   Lab Results  Component Value Date   HDL  85 11/28/2023   Lab Results  Component Value Date   LDLCALC 123 (H) 11/28/2023   Lab Results  Component Value Date   TRIG 105 11/28/2023   Lab Results  Component Value Date   CHOLHDL 2.7 11/28/2023   Lab Results  Component Value Date   HGBA1C 7.6 (H) 11/28/2023      Assessment & Plan:  Essential hypertension Assessment & Plan: Controlled She takes olmesartan -amlodipine -hydrochlorothiazide  40-5-25 daily Asymptomatic today in the clinic Encouraged low-sodium diet with increased physical activity BP Readings from Last 3 Encounters:  04/28/24 138/82  12/01/23 (!) 190/71  11/15/23 136/80     Orders: -     Olmesartan -amLODIPine -HCTZ; Take 1 tablet by mouth daily.  Dispense: 90 tablet; Refill: 0  Type 2 diabetes mellitus with hyperglycemia, without long-term current use of insulin (HCC) Assessment & Plan: Initiate: Mounjaro  (tirzepatide ) 2.5 mg subcutaneous injection once weekly.  Patient instructed on correct injection technique, rotating injection sites, and adherence to a weekly dosing schedule.  -potential side effects (nausea, vomiting, diarrhea, decreased appetite, injection site reactions) and advised to report severe abdominal pain (possible pancreatitis) or signs of allergic reaction. -I recommend maintaining lifestyle modifications: balanced diet, reduced intake of high-sugar foods and beverages, and at least 150 minutes of moderate-intensity physical activity per week as tolerated.  Orders: -     Glimepiride ; Take 1 tablet (4 mg total) by mouth daily before breakfast.  Dispense: 30 tablet; Refill: 3 -     Dapagliflozin  Propanediol; Take 1 tablet (10 mg total) by mouth daily before breakfast.  Dispense: 60 tablet; Refill: 1 -     Tirzepatide ; Inject 2.5 mg into the skin once a week.  Dispense: 2 mL; Refill: 0 -     Microalbumin / creatinine urine ratio -     HM Diabetes Foot Exam -     Hemoglobin  A1c  Hyperlipidemia associated with type 2 diabetes mellitus (HCC) Assessment & Plan: The patient was encouraged to continue taking Rosuvastatin  10 mg daily for cholesterol management. Lifestyle modifications were also discussed, including avoiding simple carbohydrates such as cakes, sweet desserts, ice cream, soda (diet or regular), sweet tea, candies, chips, cookies, store-bought juices, excessive alcohol (more than 1-2 drinks per day), lemonade, artificial sweeteners, donuts, coffee creamers, and sugar-free products. Additionally, the patient was advised to reduce the consumption of greasy, fatty foods and increase physical activity to support cardiovascular health. The patient verbalized understanding and is aware of the plan of care.    Hyperlipidemia LDL goal <70 -     Rosuvastatin  Calcium ; Take 1 tablet (10 mg total) by mouth daily.  Dispense: 90 tablet; Refill: 3 -     Lipid panel -     CMP14+EGFR -     CBC with Differential/Platelet  TSH (thyroid-stimulating hormone deficiency) -     TSH + free T4  Vitamin D  deficiency -     VITAMIN D  25 Hydroxy (Vit-D Deficiency, Fractures)   Note: This chart has been completed using Engineer, civil (consulting) software, and while attempts have been made to ensure accuracy, certain words and phrases may not be transcribed as intended.   Follow-up: Return in about 4 months (around 08/28/2024).   Leevi Cullars, FNP

## 2024-04-28 NOTE — Patient Instructions (Addendum)
 I appreciate the opportunity to provide care to you today!    Follow up:  4 months  Labs: please stop by the lab during the week to get your blood drawn (CBC, CMP, TSH, Lipid profile, HgA1c, Vit D)   Initiate: Mounjaro  (tirzepatide ) 2.5 mg subcutaneous injection once weekly.  Patient instructed on correct injection technique, rotating injection sites, and adherence to a weekly dosing schedule.  -potential side effects (nausea, vomiting, diarrhea, decreased appetite, injection site reactions) and advised to report severe abdominal pain (possible pancreatitis) or signs of allergic reaction. -I recommend maintaining lifestyle modifications: balanced diet, reduced intake of high-sugar foods and beverages, and at least 150 minutes of moderate-intensity physical activity per week as tolerated.   Please follow up if your symptoms worsen or fail to improve.   Attached with your AVS, you will find valuable resources for self-education. I highly recommend dedicating some time to thoroughly examine them.   Please continue to a heart-healthy diet and increase your physical activities. Try to exercise for at least five days a week.    It was a pleasure to see you and I look forward to continuing to work together on your health and well-being. Please do not hesitate to call the office if you need care or have questions about your care.  In case of emergency, please visit the Emergency Department for urgent care, or contact our clinic at [phone] to schedule an appointment. We're here to help you!   Have a wonderful day and week. With Gratitude, Raphael Fitzpatrick MSN, FNP-BC

## 2024-04-28 NOTE — Assessment & Plan Note (Signed)
 Initiate: Mounjaro  (tirzepatide ) 2.5 mg subcutaneous injection once weekly.  Patient instructed on correct injection technique, rotating injection sites, and adherence to a weekly dosing schedule.  -potential side effects (nausea, vomiting, diarrhea, decreased appetite, injection site reactions) and advised to report severe abdominal pain (possible pancreatitis) or signs of allergic reaction. -I recommend maintaining lifestyle modifications: balanced diet, reduced intake of high-sugar foods and beverages, and at least 150 minutes of moderate-intensity physical activity per week as tolerated.

## 2024-04-28 NOTE — Assessment & Plan Note (Signed)
 Controlled She takes olmesartan -amlodipine -hydrochlorothiazide  40-5-25 daily Asymptomatic today in the clinic Encouraged low-sodium diet with increased physical activity BP Readings from Last 3 Encounters:  04/28/24 138/82  12/01/23 (!) 190/71  11/15/23 136/80

## 2024-04-28 NOTE — Assessment & Plan Note (Signed)
 The patient was encouraged to continue taking Rosuvastatin 10 mg daily for cholesterol management. Lifestyle modifications were also discussed, including avoiding simple carbohydrates such as cakes, sweet desserts, ice cream, soda (diet or regular), sweet tea, candies, chips, cookies, store-bought juices, excessive alcohol (more than 1-2 drinks per day), lemonade, artificial sweeteners, donuts, coffee creamers, and sugar-free products. Additionally, the patient was advised to reduce the consumption of greasy, fatty foods and increase physical activity to support cardiovascular health. The patient verbalized understanding and is aware of the plan of care.

## 2024-05-12 ENCOUNTER — Telehealth: Payer: Self-pay | Admitting: Family Medicine

## 2024-05-12 DIAGNOSIS — E559 Vitamin D deficiency, unspecified: Secondary | ICD-10-CM | POA: Diagnosis not present

## 2024-05-12 DIAGNOSIS — E785 Hyperlipidemia, unspecified: Secondary | ICD-10-CM | POA: Diagnosis not present

## 2024-05-12 DIAGNOSIS — E038 Other specified hypothyroidism: Secondary | ICD-10-CM | POA: Diagnosis not present

## 2024-05-12 DIAGNOSIS — E1165 Type 2 diabetes mellitus with hyperglycemia: Secondary | ICD-10-CM | POA: Diagnosis not present

## 2024-05-12 NOTE — Telephone Encounter (Signed)
 [employer] PCP form Noted Copied Scanned Original in provider box Copy at front desk

## 2024-05-13 LAB — CBC WITH DIFFERENTIAL/PLATELET
Basophils Absolute: 0.1 x10E3/uL (ref 0.0–0.2)
Basos: 1 %
EOS (ABSOLUTE): 0.2 x10E3/uL (ref 0.0–0.4)
Eos: 2 %
Hematocrit: 39.2 % (ref 34.0–46.6)
Hemoglobin: 12.2 g/dL (ref 11.1–15.9)
Immature Grans (Abs): 0 x10E3/uL (ref 0.0–0.1)
Immature Granulocytes: 0 %
Lymphocytes Absolute: 4.7 x10E3/uL — ABNORMAL HIGH (ref 0.7–3.1)
Lymphs: 59 %
MCH: 26.8 pg (ref 26.6–33.0)
MCHC: 31.1 g/dL — ABNORMAL LOW (ref 31.5–35.7)
MCV: 86 fL (ref 79–97)
Monocytes Absolute: 0.5 x10E3/uL (ref 0.1–0.9)
Monocytes: 6 %
Neutrophils Absolute: 2.6 x10E3/uL (ref 1.4–7.0)
Neutrophils: 32 %
Platelets: 241 x10E3/uL (ref 150–450)
RBC: 4.56 x10E6/uL (ref 3.77–5.28)
RDW: 14.6 % (ref 11.7–15.4)
WBC: 8 x10E3/uL (ref 3.4–10.8)

## 2024-05-13 LAB — LIPID PANEL
Chol/HDL Ratio: 3.2 ratio (ref 0.0–4.4)
Cholesterol, Total: 212 mg/dL — ABNORMAL HIGH (ref 100–199)
HDL: 66 mg/dL (ref >39)
LDL Chol Calc (NIH): 115 mg/dL — ABNORMAL HIGH (ref 0–99)
Triglycerides: 178 mg/dL — ABNORMAL HIGH (ref 0–149)
VLDL Cholesterol Cal: 31 mg/dL (ref 5–40)

## 2024-05-13 LAB — TSH+FREE T4
Free T4: 1.01 ng/dL (ref 0.82–1.77)
TSH: 1.03 u[IU]/mL (ref 0.450–4.500)

## 2024-05-13 LAB — CMP14+EGFR
ALT: 18 IU/L (ref 0–32)
AST: 14 IU/L (ref 0–40)
Albumin: 4.2 g/dL (ref 3.9–4.9)
Alkaline Phosphatase: 80 IU/L (ref 44–121)
BUN/Creatinine Ratio: 22 (ref 12–28)
BUN: 19 mg/dL (ref 8–27)
Bilirubin Total: 0.3 mg/dL (ref 0.0–1.2)
CO2: 22 mmol/L (ref 20–29)
Calcium: 9.8 mg/dL (ref 8.7–10.3)
Chloride: 104 mmol/L (ref 96–106)
Creatinine, Ser: 0.88 mg/dL (ref 0.57–1.00)
Globulin, Total: 2.8 g/dL (ref 1.5–4.5)
Glucose: 118 mg/dL — ABNORMAL HIGH (ref 70–99)
Potassium: 4 mmol/L (ref 3.5–5.2)
Sodium: 142 mmol/L (ref 134–144)
Total Protein: 7 g/dL (ref 6.0–8.5)
eGFR: 73 mL/min/1.73 (ref >59)

## 2024-05-13 LAB — HEMOGLOBIN A1C
Est. average glucose Bld gHb Est-mCnc: 163 mg/dL
Hgb A1c MFr Bld: 7.3 % — ABNORMAL HIGH (ref 4.8–5.6)

## 2024-05-13 LAB — VITAMIN D 25 HYDROXY (VIT D DEFICIENCY, FRACTURES): Vit D, 25-Hydroxy: 26.2 ng/mL — ABNORMAL LOW (ref 30.0–100.0)

## 2024-05-13 NOTE — Telephone Encounter (Signed)
Put in providers box

## 2024-05-17 ENCOUNTER — Ambulatory Visit: Payer: Self-pay | Admitting: Family Medicine

## 2024-05-17 DIAGNOSIS — E1165 Type 2 diabetes mellitus with hyperglycemia: Secondary | ICD-10-CM

## 2024-05-17 DIAGNOSIS — E1169 Type 2 diabetes mellitus with other specified complication: Secondary | ICD-10-CM

## 2024-05-17 MED ORDER — ROSUVASTATIN CALCIUM 20 MG PO TABS: 20.0000 mg | ORAL_TABLET | Freq: Every day | ORAL | 3 refills | Status: DC

## 2024-05-17 MED ORDER — TIRZEPATIDE 5 MG/0.5ML ~~LOC~~ SOAJ: 5.0000 mg | SUBCUTANEOUS | 0 refills | Status: AC

## 2024-05-19 ENCOUNTER — Ambulatory Visit: Admitting: Family Medicine

## 2024-05-27 NOTE — Telephone Encounter (Signed)
 Patient picked up form

## 2024-07-22 ENCOUNTER — Telehealth: Payer: Self-pay

## 2024-07-22 NOTE — Telephone Encounter (Signed)
 Copied from CRM #[id number]. Topic: Clinical - Medication Question >> Jul 22, 2024 12:54 PM Sophia H wrote: Reason for CRM: Patient is wanting to know if PCP will prescribe metFORMIN  (GLUCOPHAGE ) 500 MG tablet  again, states that she did not like the injections and hasn't been taking them. Please advise   [id number] - [place], [place] - [address]

## 2024-08-02 ENCOUNTER — Other Ambulatory Visit: Payer: Self-pay

## 2024-08-02 ENCOUNTER — Ambulatory Visit
Admission: EM | Admit: 2024-08-02 | Discharge: 2024-08-02 | Disposition: A | Discharge status: Home / Self Care | Attending: Family Medicine | Admitting: Family Medicine

## 2024-08-02 ENCOUNTER — Encounter: Payer: Self-pay | Admitting: Emergency Medicine

## 2024-08-02 ENCOUNTER — Ambulatory Visit (INDEPENDENT_AMBULATORY_CARE_PROVIDER_SITE_OTHER)

## 2024-08-02 DIAGNOSIS — M25521 Pain in right elbow: Secondary | ICD-10-CM

## 2024-08-02 MED ORDER — ACETAMINOPHEN 500 MG PO TABS
1000.0000 mg | ORAL_TABLET | Freq: Once | ORAL | Status: AC
  Administered 2024-08-02: 1000 mg via ORAL

## 2024-08-02 MED ORDER — PREDNISONE 20 MG PO TABS: 40.0000 mg | ORAL_TABLET | Freq: Every day | ORAL | 0 refills | Status: AC

## 2024-08-02 NOTE — ED Triage Notes (Signed)
 Pt reports right elbow pain since last night. Denies any known injury but reports I hit it last week at work but that wasn't too hard. Has not tried anything prior to [facility] for symptoms.

## 2024-08-02 NOTE — Discharge Instructions (Signed)
 Rest, ice, compression, elevation and we have prescribed a short course of an anti-inflammatory for pain and inflammation.  Follow-up with orthopedics if not resolving.  Your x-ray did not show any broken bones today.

## 2024-08-03 NOTE — ED Provider Notes (Signed)
 [facility]    CSN: [id number] Arrival date & time: 08/02/24  0809      History   Chief Complaint Chief Complaint  Patient presents with   Elbow Pain    HPI Beverly Mccann is a 62 y.o. female.   Patient presenting today with right elbow pain for several days.  Now having significant pain with range of motion in this area and some swelling to the area.  Only known injury is bumping it at work about a week ago, otherwise has not had any known injury or new movements.  Denies numbness, tingling, weakness, fever, chills, discoloration.  Not trying anything over-the-counter for symptoms.    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   Hyperlipidemia associated with type 2 diabetes mellitus (HCC) 08/18/2006   OBESITY NOS 08/18/2006   Essential hypertension 08/18/2006   ECZEMA 08/18/2006   Osteoarthritis 08/18/2006   Other specified abnormal findings of blood chemistry 08/18/2006    Past Surgical History:  Procedure Laterality Date   ABDOMINAL HYSTERECTOMY     COLONOSCOPY  02/12/2012   Procedure: COLONOSCOPY;  Surgeon: Margo LITTIE Haddock, MD;  Location: [facility];  Service: Endoscopy;  Laterality: N/A;  10:30 AM   Cyst removed from right breast      OB History     Gravida  3   Para  3   Term  3   Preterm      AB      Living  3      SAB      IAB      Ectopic      Multiple      Live Births                Home Medications    Prior to Admission medications   Medication Sig Start Date End Date Taking? Authorizing Provider  predniSONE  (DELTASONE ) 20 MG tablet Take 2 tablets (40 mg total) by mouth daily with breakfast. 08/02/24  Yes Stuart Vernell Norris, PA-C  acetaminophen  (TYLENOL ) 500 MG tablet Take 500 mg by mouth every 6 (six) hours as needed.    [provider]  cetirizine  (ZYRTEC ) 10 MG tablet Take 1 tablet (10 mg total) by mouth daily. 12/01/23   Leath-Warren, Etta PARAS, NP  clobetasol  ointment (TEMOVATE ) 0.05 % Apply 1 Application topically 2 (two) times daily. 01/06/23   Rolinda Rogue, MD  dapagliflozin  propanediol (FARXIGA ) 10 MG TABS tablet Take 1 tablet (10 mg total) by mouth daily before breakfast. 04/28/24   Bacchus, Gloria Z, FNP  diclofenac  Sodium (VOLTAREN ) 1 % GEL Apply 4 g topically 4 (four) times daily. 09/08/23   Leath-Warren, Etta PARAS, NP  fluticasone  (FLONASE ) 50 MCG/ACT nasal spray Place 2 sprays into both nostrils daily. 12/01/23   Leath-Warren, Etta PARAS, NP  glimepiride  (AMARYL ) 4 MG tablet Take 1 tablet (4 mg total) by mouth daily before breakfast. 04/28/24   Bacchus, Meade PEDLAR, FNP  hydrOXYzine  (  ATARAX ) 25 MG tablet Take 1 tablet (25 mg total) by mouth every 8 (eight) hours as needed for itching. 12/28/22   Hildegard Loge, PA-C  levocetirizine (XYZAL ) 5 MG tablet Take 1 tablet (5 mg total) by mouth every evening. 12/20/22   Del Orbe Polanco, Iliana, FNP  meloxicam  (MOBIC ) 7.5 MG tablet Take 1 tablet (7.5 mg total) by mouth daily. 09/08/23   Leath-Warren, Etta PARAS, NP  nicotine  (NICODERM CQ ) 21 mg/24hr patch Place 1 patch (21 mg total) onto the skin daily. 12/20/22   Del Orbe Polanco, Iliana, FNP  Olmesartan -amLODIPine -HCTZ 40-5-25 MG TABS Take 1 tablet by mouth daily. 04/28/24   Bacchus, Meade PEDLAR, FNP  omeprazole  (PRILOSEC) 20 MG capsule Take 1 capsule (20 mg total) by mouth daily. 11/15/23   Bacchus, Gloria Z, FNP  rosuvastatin  (CRESTOR ) 20 MG tablet Take 1 tablet (20  mg total) by mouth daily. 05/17/24   Bacchus, Meade PEDLAR, FNP  Semaglutide  (RYBELSUS ) 7 MG TABS Take 1 tablet (7 mg total) by mouth daily. 12/01/23   Bacchus, Meade PEDLAR, FNP  tirzepatide  (MOUNJARO ) 2.5 MG/0.5ML Pen Inject 2.5 mg into the skin once a week. 04/28/24   Bacchus, Meade PEDLAR, FNP  tirzepatide  (MOUNJARO ) 5 MG/0.5ML Pen Inject 5 mg into the skin once a week. 05/17/24   Bacchus, Gloria Z, FNP  triamcinolone  cream (KENALOG ) 0.1 % APPLY CREAM TOPICALLY TWICE DAILY 01/05/23   Bacchus, Meade PEDLAR, FNP  Vitamin D , Ergocalciferol , (DRISDOL ) 1.25 MG (50000 UNIT) CAPS capsule Take 1 capsule (50,000 Units total) by mouth once a week. 05/29/23   Bacchus, Meade PEDLAR, FNP    Family History Family History  Problem Relation Age of Onset   Hypertension Mother    Diabetes Mother    Hypertension Father     Social History Social History   Tobacco Use   Smoking status: Former    Current packs/day: 0.00    Average packs/day: 0.5 packs/day for 15.0 years (7.5 ttl pk-yrs)    Types: Cigarettes    Start date: 11/11/1996    Quit date: 11/12/2011    Years since quitting: 12.7   Smokeless tobacco: Never  Vaping Use   Vaping status: Never Used  Substance Use Topics   Alcohol use: Yes    Comment: occasionally   Drug use: No     Allergies   Clindamycin/lincomycin   Review of Systems Review of Systems Per HPI  Physical Exam Triage Vital Signs ED Triage Vitals  Encounter Vitals Group     BP 08/02/24 0822 (!) 144/84     Girls Systolic BP Percentile --      Girls Diastolic BP Percentile --      Boys Systolic BP Percentile --      Boys Diastolic BP Percentile --      Pulse Rate 08/02/24 0822 71     Resp 08/02/24 0822 20     Temp 08/02/24 0822 98.2 F (36.8 C)     Temp Source 08/02/24 0822 Oral     SpO2 08/02/24 0822 97 %     Weight --      Height --      Head Circumference --      Peak Flow --      Pain Score 08/02/24 0823 8     Pain Loc --      Pain Education --      Exclude from Growth Chart  --    No data found.  Updated Vital Signs BP (!) 144/84 (BP Location: Right  Arm)   Pulse 71   Temp 98.2 F (36.8 C) (Oral)   Resp 20   SpO2 97%   Visual Acuity Right Eye Distance:   Left Eye Distance:   Bilateral Distance:    Right Eye Near:   Left Eye Near:    Bilateral Near:     Physical Exam Vitals and nursing note reviewed.  Constitutional:      Appearance: Normal appearance. She is not ill-appearing.  HENT:     Head: Atraumatic.  Eyes:     Extraocular Movements: Extraocular movements intact.     Conjunctiva/sclera: Conjunctivae normal.  Cardiovascular:     Rate and Rhythm: Normal rate.  Pulmonary:     Effort: Pulmonary effort is normal.  Musculoskeletal:        General: Tenderness present. No swelling. Normal range of motion.     Cervical back: Normal range of motion and neck supple.     Comments: Right elbow tender to palpation, range of motion exam limited due to pain.  Grip strength full and equal bilateral hands.  No bony deformity palpable  Skin:    General: Skin is warm and dry.     Findings: No bruising or erythema.  Neurological:     Mental Status: She is alert and oriented to person, place, and time.     Comments: Right upper extremity neurovascularly intact  Psychiatric:        Mood and Affect: Mood normal.        Thought Content: Thought content normal.        Judgment: Judgment normal.      UC Treatments / Results  Labs (all labs ordered are listed, but only abnormal results are displayed) Labs Reviewed - No data to display  EKG   Radiology DG Elbow Complete Right Result Date: 08/02/2024 EXAM: 3 VIEW(S) XRAY OF THE  ELBOW COMPARISON: None available. CLINICAL HISTORY: Right elbow pain x several days, states hit it on something at work FINDINGS: BONES AND JOINTS: No acute fracture. No focal osseous lesion. No joint dislocation. No joint effusion. SOFT TISSUES: The soft tissues are unremarkable. IMPRESSION: 1. No acute abnormality.  Electronically signed by: Norleen Kil MD 08/02/2024 09:58 AM EST RP Workstation: [id number]    Procedures Procedures (including critical care time)  Medications Ordered in UC Medications  acetaminophen  (TYLENOL ) tablet 1,000 mg (1,000 mg Oral Given 08/02/24 1014)    Initial Impression / Assessment and Plan / UC Course  I have reviewed the triage vital signs and the nursing notes.  Pertinent labs & imaging results that were available during my care of the patient were reviewed by me and considered in my medical decision making (see chart for details).     X-ray of the right elbow negative for acute bony abnormality.  Tylenol  given in clinic for pain, and discussed short course of prednisone  for pain and inflammation, Ace wrap applied and she is requesting a sling for comfort.  This was placed additionally prior to discharge.  Discussed RICE, over-the-counter pain relievers and Ortho follow-up if not resolving.  Final Clinical Impressions(s) / UC Diagnoses   Final diagnoses:  Right elbow pain     Discharge Instructions      Rest, ice, compression, elevation and we have prescribed a short course of an anti-inflammatory for pain and inflammation.  Follow-up with orthopedics if not resolving.  Your x-ray did not show any broken bones today.    ED Prescriptions     Medication Sig Dispense Auth. Provider  predniSONE  (DELTASONE ) 20 MG tablet Take 2 tablets (40 mg total) by mouth daily with breakfast. 6 tablet Stuart Vernell Norris, [place]      PDMP not reviewed this encounter.   Stuart Vernell Norris, [place] 08/03/24 1456

## 2024-08-13 ENCOUNTER — Telehealth: Payer: Self-pay | Admitting: Family Medicine

## 2024-08-13 NOTE — Telephone Encounter (Signed)
 Prescription Request  08/13/2024  LOV: 04/28/2024  What is the name of the medication or equipment? ,metformin   Have you contacted your pharmacy to request a refill? No   Which pharmacy would you like this sent to?  [facility] [address] [place] - [address] [place] [address] Phone: [phone] Fax: [phone]    Patient notified that their request is being sent to the clinical staff for review and that they should receive a response within 2 business days.   Please advise at Mobile [phone] (mobile)

## 2024-09-01 ENCOUNTER — Telehealth: Admitting: Family Medicine

## 2024-09-01 DIAGNOSIS — E1165 Type 2 diabetes mellitus with hyperglycemia: Secondary | ICD-10-CM

## 2024-09-01 DIAGNOSIS — E559 Vitamin D deficiency, unspecified: Secondary | ICD-10-CM | POA: Diagnosis not present

## 2024-09-01 DIAGNOSIS — I10 Essential (primary) hypertension: Secondary | ICD-10-CM

## 2024-09-01 DIAGNOSIS — E038 Other specified hypothyroidism: Secondary | ICD-10-CM

## 2024-09-01 DIAGNOSIS — Z7984 Long term (current) use of oral hypoglycemic drugs: Secondary | ICD-10-CM | POA: Diagnosis not present

## 2024-09-01 DIAGNOSIS — Z7985 Long-term (current) use of injectable non-insulin antidiabetic drugs: Secondary | ICD-10-CM | POA: Diagnosis not present

## 2024-09-01 MED ORDER — METFORMIN HCL 500 MG PO TABS: 500.0000 mg | ORAL_TABLET | Freq: Two times a day (BID) | ORAL | 3 refills | Status: AC

## 2024-09-01 MED ORDER — TIRZEPATIDE 7.5 MG/0.5ML ~~LOC~~ SOAJ: 7.5000 mg | SUBCUTANEOUS | 0 refills | Status: AC

## 2024-09-01 MED ORDER — OLMESARTAN-AMLODIPINE-HCTZ 40-5-25 MG PO TABS: 1.0000 | ORAL_TABLET | Freq: Every day | ORAL | 2 refills | Status: AC

## 2024-09-01 NOTE — Progress Notes (Signed)
 "  Virtual Visit via Video Note  I connected with Beverly Mccann on 09/01/2024 at  9:00 AM EST by a video enabled telemedicine application and verified that I am speaking with the correct person using two identifiers.  Patient Location: Home Provider Location: Home Office  I discussed the limitations, risks, security, and privacy concerns of performing an evaluation and management service by video and the availability of in person appointments. I also discussed with the patient that there may be a patient responsible charge related to this service. The patient expressed understanding and agreed to proceed.  Subjective: PCP: Edman Meade PEDLAR, FNP  Chief Complaint  Patient presents with   Medical Management of Chronic Issues    Follow up   HPI The patient is seen today for chronic condition management and is requesting medication refills. No further complaints or concerns were reported.   ROS: Per HPI Current Medications[1]  Observations/Objective: There were no vitals filed for this visit. Physical Exam Patient is well-developed, well-nourished in no acute distress.  Resting comfortably at home.  Head is normocephalic, atraumatic.  No labored breathing.  Speech is clear and coherent with logical content.  Patient is alert and oriented at baseline.   Assessment and Plan: Type 2 diabetes mellitus with hyperglycemia, without long-term current use of insulin (HCC) -     Tirzepatide ; Inject 7.5 mg into the skin once a week.  Dispense: 2 mL; Refill: 0 -     metFORMIN  HCl; Take 1 tablet (500 mg total) by mouth 2 (two) times daily with a meal.  Dispense: 180 tablet; Refill: 3 -     Hemoglobin A1c  Essential hypertension -     Olmesartan -amLODIPine -HCTZ; Take 1 tablet by mouth daily.  Dispense: 90 tablet; Refill: 2 -     Lipid panel -     CMP14+EGFR -     CBC with Differential/Platelet  TSH (thyroid-stimulating hormone deficiency) -     TSH + free T4  Vitamin D  deficiency -      VITAMIN D  25 Hydroxy (Vit-D Deficiency, Fractures)  The patient was encouraged to continue the current treatment regimen as prescribed. Lifestyle changes were encouraged. Medication refills were sent to the pharmacy.   Follow Up Instructions: Return in about 5 months (around 01/30/2025).   I discussed the assessment and treatment plan with the patient. The patient was provided an opportunity to ask questions, and all were answered. The patient agreed with the plan and demonstrated an understanding of the instructions.   The patient was advised to call back or seek an in-person evaluation if the symptoms worsen or if the condition fails to improve as anticipated.  The above assessment and management plan was discussed with the patient. The patient verbalized understanding of and has agreed to the management plan.   Meade PEDLAR Edman, FNP     [1]  Current Outpatient Medications:    metFORMIN  (GLUCOPHAGE ) 500 MG tablet, Take 1 tablet (500 mg total) by mouth 2 (two) times daily with a meal., Disp: 180 tablet, Rfl: 3   tirzepatide  (MOUNJARO ) 7.5 MG/0.5ML Pen, Inject 7.5 mg into the skin once a week., Disp: 2 mL, Rfl: 0   acetaminophen  (TYLENOL ) 500 MG tablet, Take 500 mg by mouth every 6 (six) hours as needed., Disp: , Rfl:    cetirizine  (ZYRTEC ) 10 MG tablet, Take 1 tablet (10 mg total) by mouth daily., Disp: 30 tablet, Rfl: 0   clobetasol  ointment (TEMOVATE ) 0.05 %, Apply 1 Application topically 2 (  two) times daily., Disp: 120 g, Rfl: 1   dapagliflozin  propanediol (FARXIGA ) 10 MG TABS tablet, Take 1 tablet (10 mg total) by mouth daily before breakfast., Disp: 60 tablet, Rfl: 1   diclofenac  Sodium (VOLTAREN ) 1 % GEL, Apply 4 g topically 4 (four) times daily., Disp: 150 g, Rfl: 0   fluticasone  (FLONASE ) 50 MCG/ACT nasal spray, Place 2 sprays into both nostrils daily., Disp: 16 g, Rfl: 0   hydrOXYzine  (ATARAX ) 25 MG tablet, Take 1 tablet (25 mg total) by mouth every 8 (eight) hours as needed for  itching., Disp: 12 tablet, Rfl: 0   levocetirizine (XYZAL ) 5 MG tablet, Take 1 tablet (5 mg total) by mouth every evening., Disp: 30 tablet, Rfl: 0   meloxicam  (MOBIC ) 7.5 MG tablet, Take 1 tablet (7.5 mg total) by mouth daily., Disp: 30 tablet, Rfl: 0   nicotine  (NICODERM CQ ) 21 mg/24hr patch, Place 1 patch (21 mg total) onto the skin daily., Disp: 28 patch, Rfl: 0   Olmesartan -amLODIPine -HCTZ 40-5-25 MG TABS, Take 1 tablet by mouth daily., Disp: 90 tablet, Rfl: 2   omeprazole  (PRILOSEC) 20 MG capsule, Take 1 capsule (20 mg total) by mouth daily., Disp: 30 capsule, Rfl: 3   predniSONE  (DELTASONE ) 20 MG tablet, Take 2 tablets (40 mg total) by mouth daily with breakfast., Disp: 6 tablet, Rfl: 0   rosuvastatin  (CRESTOR ) 20 MG tablet, Take 1 tablet (20 mg total) by mouth daily., Disp: 90 tablet, Rfl: 3   tirzepatide  (MOUNJARO ) 2.5 MG/0.5ML Pen, Inject 2.5 mg into the skin once a week., Disp: 2 mL, Rfl: 0   tirzepatide  (MOUNJARO ) 5 MG/0.5ML Pen, Inject 5 mg into the skin once a week., Disp: 2 mL, Rfl: 0   triamcinolone  cream (KENALOG ) 0.1 %, APPLY CREAM TOPICALLY TWICE DAILY, Disp: 15 g, Rfl: 0   Vitamin D , Ergocalciferol , (DRISDOL ) 1.25 MG (50000 UNIT) CAPS capsule, Take 1 capsule (50,000 Units total) by mouth once a week., Disp: 20 capsule, Rfl: 1  "

## 2024-09-09 LAB — TSH+FREE T4
Free T4: 1.09 ng/dL (ref 0.82–1.77)
TSH: 0.961 u[IU]/mL (ref 0.450–4.500)

## 2024-09-09 LAB — CBC WITH DIFFERENTIAL/PLATELET
Basophils Absolute: 0 x10E3/uL (ref 0.0–0.2)
Basos: 1 %
EOS (ABSOLUTE): 0.1 x10E3/uL (ref 0.0–0.4)
Eos: 2 %
Hematocrit: 43.6 % (ref 34.0–46.6)
Hemoglobin: 13.8 g/dL (ref 11.1–15.9)
Immature Grans (Abs): 0 x10E3/uL (ref 0.0–0.1)
Immature Granulocytes: 0 %
Lymphocytes Absolute: 4.1 x10E3/uL — ABNORMAL HIGH (ref 0.7–3.1)
Lymphs: 52 %
MCH: 26.8 pg (ref 26.6–33.0)
MCHC: 31.7 g/dL (ref 31.5–35.7)
MCV: 85 fL (ref 79–97)
Monocytes Absolute: 0.5 x10E3/uL (ref 0.1–0.9)
Monocytes: 7 %
Neutrophils Absolute: 2.9 x10E3/uL (ref 1.4–7.0)
Neutrophils: 38 %
Platelets: 266 x10E3/uL (ref 150–450)
RBC: 5.14 x10E6/uL (ref 3.77–5.28)
RDW: 14.4 % (ref 11.7–15.4)
WBC: 7.8 x10E3/uL (ref 3.4–10.8)

## 2024-09-09 LAB — CMP14+EGFR
ALT: 15 IU/L (ref 0–32)
AST: 18 IU/L (ref 0–40)
Albumin: 4.6 g/dL (ref 3.9–4.9)
Alkaline Phosphatase: 80 IU/L (ref 49–135)
BUN/Creatinine Ratio: 18 (ref 12–28)
BUN: 14 mg/dL (ref 8–27)
Bilirubin Total: 0.6 mg/dL (ref 0.0–1.2)
CO2: 26 mmol/L (ref 20–29)
Calcium: 10.7 mg/dL — ABNORMAL HIGH (ref 8.7–10.3)
Chloride: 99 mmol/L (ref 96–106)
Creatinine, Ser: 0.76 mg/dL (ref 0.57–1.00)
Globulin, Total: 3.2 g/dL (ref 1.5–4.5)
Glucose: 108 mg/dL — ABNORMAL HIGH (ref 70–99)
Potassium: 3.5 mmol/L (ref 3.5–5.2)
Sodium: 144 mmol/L (ref 134–144)
Total Protein: 7.8 g/dL (ref 6.0–8.5)
eGFR: 87 mL/min/1.73

## 2024-09-09 LAB — HEMOGLOBIN A1C
Est. average glucose Bld gHb Est-mCnc: 169 mg/dL
Hgb A1c MFr Bld: 7.5 % — ABNORMAL HIGH (ref 4.8–5.6)

## 2024-09-09 LAB — LIPID PANEL
Chol/HDL Ratio: 3.5 ratio (ref 0.0–4.4)
Cholesterol, Total: 263 mg/dL — ABNORMAL HIGH (ref 100–199)
HDL: 76 mg/dL
LDL Chol Calc (NIH): 163 mg/dL — ABNORMAL HIGH (ref 0–99)
Triglycerides: 138 mg/dL (ref 0–149)
VLDL Cholesterol Cal: 24 mg/dL (ref 5–40)

## 2024-09-09 LAB — VITAMIN D 25 HYDROXY (VIT D DEFICIENCY, FRACTURES): Vit D, 25-Hydroxy: 22.6 ng/mL — ABNORMAL LOW (ref 30.0–100.0)

## 2024-09-10 ENCOUNTER — Ambulatory Visit: Payer: Self-pay | Admitting: Family Medicine

## 2024-09-10 NOTE — Telephone Encounter (Signed)
 FYI Only or Action Required?: Action required by provider: clinical question for provider, update on patient condition, and Pt would like to know if something can be prescribed for constipation. Last BM 1/4. She would like to be called/notified if this can be prescribed. . Preferred pharmacy: [facility] [address] [place] - [phone]   Patient was last seen in primary care on 09/01/2024 by Edman Meade PEDLAR, FNP.  Called Nurse Triage reporting Constipation (/).  Symptoms began 09/07/24.  Interventions attempted: OTC medications: Milk of Magnesia.  Symptoms are: gradually worsening.  Triage Disposition: Call PCP When Office is Open  Patient/caregiver understands and will follow disposition?: Yes    Last time pt had a BM was 1/4. Denies hx of constipation, normally goes every other day. Wanting to know if something can be prescribed. Belly feels bloated. No pain. Poor appetite. Threw up once on Friday, yellow/clear colored. Tired taking milk of magnesia which normally works well for her but didn't work this time. Noted to have taken Mounjaro  for the first time on Sunday. Wondering if this is related. Forwarding request to office. Advised UC or ED for worsening symptoms.    Copied from CRM [id number]. Topic: Clinical - Medication Question >> Sep 10, 2024  9:43 AM Roselie BROCKS wrote: Reason for CRM: Patient requesting some medication for constipation. Patient stated she has not had a bowl movement since 09-07-24. Patient uses [facility] in [place] . Patient do need expedited Reason for Disposition  Taking new prescription medication  Answer Assessment - Initial Assessment Questions 1. STOOL PATTERN OR FREQUENCY: How often do you have a bowel movement (BM)?  (Normal range: 3 times a day to every 3 days)  When was your last BM?       Every other day  2. STRAINING: Do you have to strain to have a BM?      Hasn't gone since 1/4  3. ONSET: When did the constipation  begin?     1/4  4. RECTAL PAIN: Does your rectum hurt when the stool comes out? If Yes, ask: Do you have hemorrhoids? How bad is the pain?  (Scale 1-10; or mild, moderate, severe)     Denies  5. BM COMPOSITION: Are the stools hard?      Hasn't had a BM since 1/4  6. BLOOD ON STOOLS: Has there been any blood on the toilet tissue or on the surface of the BM? If Yes, ask: When was the last time?     Hasn't had a BM since 1/4  7. CHRONIC CONSTIPATION: Is this a new problem for you?  If No, ask: How long have you had this problem? (days, weeks, months)      Does not have a hx of constipation  8. CHANGES IN DIET OR HYDRATION: Have there been any recent changes in your diet? How much fluids are you drinking on a daily basis?  How much have you had to drink today?     No changes to diet. Drinking about 3-4 bottles of water  per day.  9. MEDICINES: Have you been taking any new medicines? Are you taking any narcotic pain medicines? (e.g., Dilaudid, morphine, Percocet, Vicodin)     Took Mounjaro  for the first time on Sunday.   10. LAXATIVES: Have you been using any stool softeners, laxatives, or enemas?  If Yes, ask What are you using, how often, and when was the last time?       Milk of Magnesia  11.  ACTIVITY:  How much walking do you do every day?  Has your activity level decreased in the past week?        Walks a lot   12. CAUSE: What do you think is causing the constipation?        Mounjaro    13. MEDICAL HISTORY: Do you have a history of hemorrhoids, rectal fissures, rectal surgery, or rectal abscess?         Denies  14. OTHER SYMPTOMS: Do you have any other symptoms? (e.g., abdomen pain, bloating, fever, vomiting)       Bloating, abdominal discomfort. Threw up x1 on Friday.  Protocols used: Constipation-A-AH

## 2024-09-11 ENCOUNTER — Other Ambulatory Visit: Payer: Self-pay | Admitting: Family Medicine

## 2024-09-11 DIAGNOSIS — K5909 Other constipation: Secondary | ICD-10-CM

## 2024-09-11 MED ORDER — DOCUSATE SODIUM 100 MG PO CAPS: 100.0000 mg | ORAL_CAPSULE | Freq: Two times a day (BID) | ORAL | 0 refills | Status: AC

## 2024-09-12 NOTE — Telephone Encounter (Signed)
 Patient advised.

## 2024-09-13 ENCOUNTER — Ambulatory Visit: Payer: Self-pay | Admitting: Family Medicine

## 2024-09-13 DIAGNOSIS — E1169 Type 2 diabetes mellitus with other specified complication: Secondary | ICD-10-CM

## 2024-09-13 DIAGNOSIS — E559 Vitamin D deficiency, unspecified: Secondary | ICD-10-CM

## 2024-09-13 MED ORDER — ROSUVASTATIN CALCIUM 40 MG PO TABS: 40.0000 mg | ORAL_TABLET | Freq: Every day | ORAL | 3 refills | Status: AC

## 2024-09-13 MED ORDER — VITAMIN D (ERGOCALCIFEROL) 1.25 MG (50000 UNIT) PO CAPS: 50000.0000 [IU] | ORAL_CAPSULE | ORAL | 1 refills | Status: AC
